# Patient Record
Sex: Male | Born: 1970 | Race: Black or African American | Hispanic: No | Smoking: Former smoker
Health system: Southern US, Community
[De-identification: ages and names within clinical notes are randomized; demographics above are authoritative.]

## PROBLEM LIST (undated history)

## (undated) DIAGNOSIS — F4310 Post-traumatic stress disorder, unspecified: Secondary | ICD-10-CM

## (undated) DIAGNOSIS — F32A Depression, unspecified: Secondary | ICD-10-CM

## (undated) DIAGNOSIS — I10 Essential (primary) hypertension: Secondary | ICD-10-CM

## (undated) HISTORY — PX: ABDOMINAL SURGERY: SHX537

---

## 2007-04-12 ENCOUNTER — Emergency Department: Payer: Self-pay | Admitting: Emergency Medicine

## 2010-10-06 ENCOUNTER — Emergency Department: Payer: Self-pay | Admitting: Emergency Medicine

## 2018-11-24 ENCOUNTER — Encounter: Payer: Self-pay | Admitting: Emergency Medicine

## 2018-11-24 ENCOUNTER — Emergency Department
Admission: EM | Admit: 2018-11-24 | Discharge: 2018-11-24 | Disposition: A | Discharge status: Home / Self Care | Payer: Self-pay | Attending: Emergency Medicine | Admitting: Emergency Medicine

## 2018-11-24 ENCOUNTER — Other Ambulatory Visit: Payer: Self-pay

## 2018-11-24 DIAGNOSIS — R739 Hyperglycemia, unspecified: Secondary | ICD-10-CM

## 2018-11-24 DIAGNOSIS — Z79899 Other long term (current) drug therapy: Secondary | ICD-10-CM | POA: Insufficient documentation

## 2018-11-24 DIAGNOSIS — E1165 Type 2 diabetes mellitus with hyperglycemia: Secondary | ICD-10-CM | POA: Insufficient documentation

## 2018-11-24 DIAGNOSIS — I10 Essential (primary) hypertension: Secondary | ICD-10-CM | POA: Insufficient documentation

## 2018-11-24 DIAGNOSIS — E119 Type 2 diabetes mellitus without complications: Secondary | ICD-10-CM

## 2018-11-24 DIAGNOSIS — R350 Frequency of micturition: Secondary | ICD-10-CM | POA: Insufficient documentation

## 2018-11-24 HISTORY — DX: Essential (primary) hypertension: I10

## 2018-11-24 LAB — BASIC METABOLIC PANEL
ANION GAP: 11 (ref 5–15)
BUN: 14 mg/dL (ref 6–20)
CHLORIDE: 98 mmol/L (ref 98–111)
CO2: 23 mmol/L (ref 22–32)
Calcium: 9.3 mg/dL (ref 8.9–10.3)
Creatinine, Ser: 1.04 mg/dL (ref 0.61–1.24)
GFR calc Af Amer: >60 mL/min (ref >60)
GFR calc non Af Amer: >60 mL/min (ref >60)
Glucose, Bld: 542 mg/dL (ref 70–99)
POTASSIUM: 4 mmol/L (ref 3.5–5.1)
Sodium: 132 mmol/L — ABNORMAL LOW (ref 135–145)

## 2018-11-24 LAB — CBC
HCT: 47.6 % (ref 39.0–52.0)
Hemoglobin: 14.5 g/dL (ref 13.0–17.0)
MCH: 26 pg (ref 26.0–34.0)
MCHC: 30.5 g/dL (ref 30.0–36.0)
MCV: 85.5 fL (ref 80.0–100.0)
NRBC: 0 % (ref 0.0–0.2)
PLATELETS: 297 10*3/uL (ref 150–400)
RBC: 5.57 MIL/uL (ref 4.22–5.81)
RDW: 14 % (ref 11.5–15.5)
WBC: 5.8 10*3/uL (ref 4.0–10.5)

## 2018-11-24 LAB — GLUCOSE, CAPILLARY
Glucose-Capillary: 453 mg/dL — ABNORMAL HIGH (ref 70–99)
Glucose-Capillary: 302 mg/dL — ABNORMAL HIGH (ref 70–99)

## 2018-11-24 MED ORDER — SODIUM CHLORIDE 0.9 % IV BOLUS
1000.0000 mL | Freq: Once | INTRAVENOUS | Status: AC
  Administered 2018-11-24: 1000 mL via INTRAVENOUS

## 2018-11-24 MED ORDER — METFORMIN HCL 500 MG PO TABS: 500.0000 mg | ORAL_TABLET | Freq: Two times a day (BID) | ORAL | 11 refills | Status: DC

## 2018-11-24 MED ORDER — METFORMIN HCL 500 MG PO TABS
500.0000 mg | ORAL_TABLET | Freq: Once | ORAL | Status: AC
  Administered 2018-11-24: 500 mg via ORAL
  Filled 2018-11-24: qty 1

## 2018-11-24 MED ORDER — INSULIN ASPART 100 UNIT/ML ~~LOC~~ SOLN
4.0000 [IU] | Freq: Once | SUBCUTANEOUS | Status: AC
  Administered 2018-11-24: 4 [IU] via INTRAVENOUS
  Filled 2018-11-24: qty 1

## 2018-11-24 MED ORDER — ENALAPRIL MALEATE 5 MG PO TABS: 5.0000 mg | ORAL_TABLET | Freq: Every day | ORAL | 11 refills | Status: DC

## 2018-11-24 MED ORDER — ENALAPRIL MALEATE 10 MG PO TABS
5.0000 mg | ORAL_TABLET | Freq: Once | ORAL | Status: AC
  Administered 2018-11-24: 5 mg via ORAL
  Filled 2018-11-24: qty 1

## 2018-11-24 NOTE — ED Triage Notes (Signed)
Tuesday, pt went to his wife's work to have blood sugar checked and it was in the 500's, pt has been thirsty and urinating a lot recently, no history of diabetes, NAD.

## 2018-11-24 NOTE — Discharge Instructions (Addendum)
Please start the Metformin for the diabetes,  Take it 1 twice a day.  Take Enalapril 1 a day for your blood pressure.  Please return if your blood sugar goes over 300.  Please get a primary care doctor to follow-up with you.  You can try the health department or the [facility] or the [facility] or the [facility] or the [facility] or you can try [facility] charity care.  For now I would do fingersticks before breakfast, before lunch, before dinner and before bed.  Keep a written record of your fingersticks every day.  Feel free to return here for any problems.  Please understand that it is very likely that we will have to increase the dose of the Metformin and the enalapril to control her diabetes and her blood pressure.  We will start out low and go up.

## 2018-11-24 NOTE — ED Provider Notes (Signed)
[facility] Emergency Department Provider Note         ____________________________________________   First MD Initiated Contact with Patient 11/24/18 1135     (approximate)  I have reviewed the triage vital signs and the nursing notes.   HISTORY  Chief Complaint Hyperglycemia   HPI Cameron Prince is a 48 y.o. male who was recently released from prison.  He was released with months worth of metoprolol and HCTZ prescription but has run out.  He also reports he has been drinking a lot of fluid lately and urinating a lot lately his wife did a fingerstick on him today and found that his blood sugar was over 500.  He is come in the hospital here.  He denies any coughing sneezing belly aches dysuria or any other problems.  Past Medical History:  Diagnosis Date  . Hypertension     There are no active problems to display for this patient.   Past Surgical History:  Procedure Laterality Date  . ABDOMINAL SURGERY      Prior to Admission medications   Medication Sig Start Date End Date Taking? Authorizing Provider  hydrochlorothiazide (HYDRODIURIL) 25 MG tablet Take 25 mg by mouth daily.   Yes [provider]  metoprolol tartrate (LOPRESSOR) 50 MG tablet Take 50 mg by mouth 2 (two) times daily.   Yes [provider]  enalapril (VASOTEC) 5 MG tablet Take 1 tablet (5 mg total) by mouth daily. 11/24/18 11/24/19  Arnaldo Natal, MD  metFORMIN (GLUCOPHAGE) 500 MG tablet Take 1 tablet (500 mg total) by mouth 2 (two) times daily with a meal. 11/24/18 11/24/19  Arnaldo Natal, MD    Allergies Patient has no known allergies.  No family history on file.  Social History Social History   Tobacco Use  . Smoking status: Never Smoker  . Smokeless tobacco: Never Used  Substance Use Topics  . Alcohol use: Not Currently  . Drug use: Not on file    Review of Systems  Constitutional: No fever/chills Eyes: No visual changes. ENT: No sore  throat. Cardiovascular: Denies chest pain. Respiratory: Denies shortness of breath. Gastrointestinal: No abdominal pain.  No nausea, no vomiting.  No diarrhea.  No constipation. Genitourinary: Negative for dysuria. Musculoskeletal: Negative for back pain. Skin: Negative for rash. Neurological: Negative for headaches, focal weakness   ____________________________________________   PHYSICAL EXAM:  VITAL SIGNS: ED Triage Vitals  Enc Vitals Group     BP 11/24/18 1020 (!) 157/92     Pulse Rate 11/24/18 1020 82     Resp 11/24/18 1020 16     Temp 11/24/18 1020 97.7 F (36.5 C)     Temp Source 11/24/18 1020 Oral     SpO2 11/24/18 1020 98 %     Weight 11/24/18 1021 (!) 340 lb (154.2 kg)     Height 11/24/18 1021 6\' 4"  (1.93 m)     Head Circumference --      Peak Flow --      Pain Score 11/24/18 1020 7     Pain Loc --      Pain Edu? --      Excl. in GC? --     Constitutional: Alert and oriented. Well appearing and in no acute distress. Eyes: Conjunctivae are normal. Head: Atraumatic. Nose: No congestion/rhinnorhea. Mouth/Throat: Mucous membranes are moist.  Oropharynx non-erythematous. Neck: No stridor.  Cardiovascular: Normal rate, regular rhythm. Grossly normal heart sounds.  Good peripheral circulation. Respiratory: Normal respiratory effort.  No retractions. Lungs CTAB. Gastrointestinal: Soft and nontender. No distention. No abdominal bruits. No CVA tenderness. }Musculoskeletal: No lower extremity tenderness slight trace edema.  Neurologic:  Normal speech and language. No gross focal neurologic deficits are appreciated.. Skin:  Skin is warm, dry and intact. No rash noted. Psychiatric: Mood and affect are normal. Speech and behavior are normal.  ____________________________________________   LABS (all labs ordered are listed, but only abnormal results are displayed)  Labs Reviewed  BASIC METABOLIC PANEL - Abnormal; Notable for the following components:      Result Value    Sodium 132 (*)    Glucose, Bld 542 (*)    All other components within normal limits  GLUCOSE, CAPILLARY - Abnormal; Notable for the following components:   Glucose-Capillary 453 (*)    All other components within normal limits  GLUCOSE, CAPILLARY - Abnormal; Notable for the following components:   Glucose-Capillary 302 (*)    All other components within normal limits  CBC   ____________________________________________  EKG   ____________________________________________  RADIOLOGY  ED MD interpretation:  Official radiology report(s): No results found.  ____________________________________________   PROCEDURES  Procedure(s) performed:   Procedures  Critical Care performed:   ____________________________________________   INITIAL IMPRESSION / ASSESSMENT AND PLAN / ED COURSE Patient with new onset diabetes.  Sugar comes down nicely with fluids and little bit of insulin.  We will get him started on metformin.  He is out of his HCTZ and metoprolol.  In view of his new onset diabetes we will switch him to enalapril hopefully this will work for him without causing any cough or any other complications.  He knows to come back if he has any other problems.  He will call from the pharmacy or have the pharmacy call while he is there if any of this is too expensive for him.  We will try to arrange follow-up either with [facility] or the [facility] or health department etc.          ____________________________________________   FINAL CLINICAL IMPRESSION(S) / ED DIAGNOSES  Final diagnoses:  Hyperglycemia  Diabetes mellitus, new onset [facility])     ED Discharge Orders         Ordered    metFORMIN (GLUCOPHAGE) 500 MG tablet  2 times daily with meals     11/24/18 1411    enalapril (VASOTEC) 5 MG tablet  Daily     11/24/18 1411           Note:  This document was prepared using Dragon voice recognition software and may include unintentional dictation errors.     Arnaldo Natal, MD 11/24/18 2134

## 2018-12-05 ENCOUNTER — Other Ambulatory Visit: Payer: Self-pay

## 2018-12-05 ENCOUNTER — Inpatient Hospital Stay
Admission: AD | Admit: 2018-12-05 | Discharge: 2018-12-07 | DRG: 885 | Disposition: A | Discharge status: Home / Self Care | Payer: No Typology Code available for payment source | Source: Intra-hospital | Attending: Psychiatry | Admitting: Psychiatry

## 2018-12-05 ENCOUNTER — Emergency Department
Admission: EM | Admit: 2018-12-05 | Discharge: 2018-12-05 | Disposition: A | Discharge status: Other Acute Inpatient Hospital | Payer: Self-pay | Attending: Emergency Medicine | Admitting: Emergency Medicine

## 2018-12-05 ENCOUNTER — Encounter: Payer: Self-pay | Admitting: Emergency Medicine

## 2018-12-05 DIAGNOSIS — Z888 Allergy status to other drugs, medicaments and biological substances status: Secondary | ICD-10-CM

## 2018-12-05 DIAGNOSIS — F4310 Post-traumatic stress disorder, unspecified: Secondary | ICD-10-CM | POA: Diagnosis present

## 2018-12-05 DIAGNOSIS — I10 Essential (primary) hypertension: Secondary | ICD-10-CM | POA: Diagnosis present

## 2018-12-05 DIAGNOSIS — F1210 Cannabis abuse, uncomplicated: Secondary | ICD-10-CM | POA: Diagnosis present

## 2018-12-05 DIAGNOSIS — R45851 Suicidal ideations: Secondary | ICD-10-CM

## 2018-12-05 DIAGNOSIS — F333 Major depressive disorder, recurrent, severe with psychotic symptoms: Secondary | ICD-10-CM | POA: Diagnosis present

## 2018-12-05 DIAGNOSIS — F1490 Cocaine use, unspecified, uncomplicated: Secondary | ICD-10-CM | POA: Diagnosis present

## 2018-12-05 DIAGNOSIS — F1420 Cocaine dependence, uncomplicated: Secondary | ICD-10-CM | POA: Diagnosis present

## 2018-12-05 DIAGNOSIS — F329 Major depressive disorder, single episode, unspecified: Secondary | ICD-10-CM | POA: Insufficient documentation

## 2018-12-05 DIAGNOSIS — Z7984 Long term (current) use of oral hypoglycemic drugs: Secondary | ICD-10-CM | POA: Diagnosis not present

## 2018-12-05 DIAGNOSIS — Z560 Unemployment, unspecified: Secondary | ICD-10-CM

## 2018-12-05 DIAGNOSIS — Z79899 Other long term (current) drug therapy: Secondary | ICD-10-CM | POA: Insufficient documentation

## 2018-12-05 DIAGNOSIS — E119 Type 2 diabetes mellitus without complications: Secondary | ICD-10-CM | POA: Diagnosis present

## 2018-12-05 DIAGNOSIS — R440 Auditory hallucinations: Secondary | ICD-10-CM | POA: Insufficient documentation

## 2018-12-05 DIAGNOSIS — F1410 Cocaine abuse, uncomplicated: Secondary | ICD-10-CM

## 2018-12-05 DIAGNOSIS — F1290 Cannabis use, unspecified, uncomplicated: Secondary | ICD-10-CM

## 2018-12-05 LAB — COMPREHENSIVE METABOLIC PANEL
ALK PHOS: 59 U/L (ref 38–126)
ALT: 22 U/L (ref 0–44)
AST: 23 U/L (ref 15–41)
Albumin: 3.9 g/dL (ref 3.5–5.0)
Anion gap: 10 (ref 5–15)
BUN: 8 mg/dL (ref 6–20)
CO2: 24 mmol/L (ref 22–32)
Calcium: 9.4 mg/dL (ref 8.9–10.3)
Chloride: 105 mmol/L (ref 98–111)
Creatinine, Ser: 0.87 mg/dL (ref 0.61–1.24)
GFR calc Af Amer: >60 mL/min (ref >60)
GFR calc non Af Amer: >60 mL/min (ref >60)
Glucose, Bld: 286 mg/dL — ABNORMAL HIGH (ref 70–99)
Potassium: 3.7 mmol/L (ref 3.5–5.1)
Sodium: 139 mmol/L (ref 135–145)
Total Bilirubin: 2 mg/dL — ABNORMAL HIGH (ref 0.3–1.2)
Total Protein: 8.2 g/dL — ABNORMAL HIGH (ref 6.5–8.1)

## 2018-12-05 LAB — URINE DRUG SCREEN, QUALITATIVE (ARMC ONLY)
Amphetamines, Ur Screen: NOT DETECTED
Barbiturates, Ur Screen: NOT DETECTED
Benzodiazepine, Ur Scrn: NOT DETECTED
CANNABINOID 50 NG, UR ~~LOC~~: NOT DETECTED
Cocaine Metabolite,Ur ~~LOC~~: POSITIVE — AB
MDMA (Ecstasy)Ur Screen: NOT DETECTED
Methadone Scn, Ur: NOT DETECTED
Opiate, Ur Screen: NOT DETECTED
Phencyclidine (PCP) Ur S: NOT DETECTED
Tricyclic, Ur Screen: NOT DETECTED

## 2018-12-05 LAB — CBC
HCT: 45.6 % (ref 39.0–52.0)
HEMOGLOBIN: 14 g/dL (ref 13.0–17.0)
MCH: 26.8 pg (ref 26.0–34.0)
MCHC: 30.7 g/dL (ref 30.0–36.0)
MCV: 87.2 fL (ref 80.0–100.0)
Platelets: 351 10*3/uL (ref 150–400)
RBC: 5.23 MIL/uL (ref 4.22–5.81)
RDW: 14.6 % (ref 11.5–15.5)
WBC: 8 10*3/uL (ref 4.0–10.5)
nRBC: 0.3 % — ABNORMAL HIGH (ref 0.0–0.2)

## 2018-12-05 LAB — HEMOGLOBIN A1C
HEMOGLOBIN A1C: 12.1 % — AB (ref 4.8–5.6)
Mean Plasma Glucose: 300.57 mg/dL

## 2018-12-05 LAB — ETHANOL: Alcohol, Ethyl (B): <10 mg/dL (ref <10)

## 2018-12-05 LAB — SALICYLATE LEVEL

## 2018-12-05 LAB — ACETAMINOPHEN LEVEL: Acetaminophen (Tylenol), Serum: <10 ug/mL — ABNORMAL LOW (ref 10–30)

## 2018-12-05 MED ORDER — TRAZODONE HCL 50 MG PO TABS: 50.0000 mg | ORAL_TABLET | Freq: Every evening | ORAL | Status: DC | PRN

## 2018-12-05 MED ORDER — HYDROXYZINE HCL 25 MG PO TABS: 25.0000 mg | ORAL_TABLET | ORAL | Status: DC | PRN

## 2018-12-05 MED ORDER — ALUM & MAG HYDROXIDE-SIMETH 200-200-20 MG/5ML PO SUSP: 30.0000 mL | ORAL | Status: DC | PRN

## 2018-12-05 MED ORDER — FLUOXETINE HCL 20 MG PO CAPS
40.0000 mg | ORAL_CAPSULE | Freq: Every day | ORAL | Status: DC
  Administered 2018-12-05: 40 mg via ORAL
  Filled 2018-12-05: qty 2

## 2018-12-05 MED ORDER — ACETAMINOPHEN 325 MG PO TABS: 650.0000 mg | ORAL_TABLET | Freq: Four times a day (QID) | ORAL | Status: DC | PRN

## 2018-12-05 MED ORDER — ENALAPRIL MALEATE 5 MG PO TABS
5.0000 mg | ORAL_TABLET | Freq: Every day | ORAL | Status: DC
  Administered 2018-12-06 – 2018-12-07 (×2): 5 mg via ORAL
  Filled 2018-12-05 (×2): qty 1

## 2018-12-05 MED ORDER — MAGNESIUM HYDROXIDE 400 MG/5ML PO SUSP: 30.0000 mL | Freq: Every day | ORAL | Status: DC | PRN

## 2018-12-05 MED ORDER — ENALAPRIL MALEATE 5 MG PO TABS
5.0000 mg | ORAL_TABLET | Freq: Every day | ORAL | Status: DC
  Administered 2018-12-05: 5 mg via ORAL
  Filled 2018-12-05: qty 1

## 2018-12-05 MED ORDER — METFORMIN HCL 500 MG PO TABS
500.0000 mg | ORAL_TABLET | Freq: Two times a day (BID) | ORAL | Status: DC
  Administered 2018-12-06 – 2018-12-07 (×3): 500 mg via ORAL
  Filled 2018-12-05 (×3): qty 1

## 2018-12-05 MED ORDER — METFORMIN HCL 500 MG PO TABS
500.0000 mg | ORAL_TABLET | Freq: Two times a day (BID) | ORAL | Status: DC
  Administered 2018-12-05: 500 mg via ORAL
  Filled 2018-12-05: qty 1

## 2018-12-05 NOTE — ED Notes (Signed)
VOL/ Consult completed/ Plan to Admit to [facility] BMU

## 2018-12-05 NOTE — ED Notes (Signed)
The patient was dressed out into the required purple scrubs. His belongings were placed into two white patient belongings bags and labeled properly.  Cloths consisted of black boots, black socks, black phone, black case with glucose check pen in it, grey/black shorts, grey underwear, faded black pants, grey shirt and grey sweatshirt. He was then walked to room 20 without any issues. Belongings were left with quad RN.

## 2018-12-05 NOTE — Progress Notes (Signed)
Patient is a new admit from ED who present him self voluntarily, for suicide ideations with plan to over dose on drugs, did not specify what kind of drugs, patient contract for safety at this time and denies any SI/HI/ AVH.  Patient endorse using cocaine and marijuana but not using cigarettes. Patient is alert and oriented x 4 stable and responding well thoughtful and logical , pleasant  upon approach, body search and skin check is complete by two nurses, no contraband found, multiple tattoos on the surface of the skin. Unit guide lines and expected behaviors are dicussed, cold tray and beverages is offered , hygiene product provided, unit and room orientation complete , room is within eyesight for frequent monitoring from the nurses station, mood is good and affect is withdrawn, remind patient of 15 minute safety rounding is maintained no distress.

## 2018-12-05 NOTE — ED Notes (Signed)
Hourly rounding reveals patient in room. No complaints, stable, in no acute distress. Q15 minute rounds and monitoring via Security Cam[employer] to continue. 

## 2018-12-05 NOTE — ED Notes (Signed)
First Nurse Note: Patient alert and oriented.  States he is here for "mental issues".

## 2018-12-05 NOTE — ED Provider Notes (Addendum)
[facility] Emergency Department Provider Note  ____________________________________________  Time seen: Approximately 11:43 AM  I have reviewed the triage vital signs and the nursing notes.   HISTORY  Chief Complaint Suicidal    HPI Cameron Prince is a 48 y.o. male with a history of hypertension, recently diagnosed type 2 diabetes, and schizophrenia who comes the ED complaining of suicidal ideation with thoughts about taking an intentional overdose.  Does not currently have an active wish to die.  He does report some auditory hallucinations, no visual hallucinations or HI.  Is been off his medications for the past 4 months since being released from incarceration.   Symptoms constant without aggravating or alleviating factors  Also complains of chronic right toes numbness.  No recent injuries.    Past Medical History:  Diagnosis Date  . Hypertension      There are no active problems to display for this patient.    Past Surgical History:  Procedure Laterality Date  . ABDOMINAL SURGERY       Prior to Admission medications   Medication Sig Start Date End Date Taking? Authorizing Provider  enalapril (VASOTEC) 5 MG tablet Take 1 tablet (5 mg total) by mouth daily. 11/24/18 11/24/19  Arnaldo Natal, MD  hydrochlorothiazide (HYDRODIURIL) 25 MG tablet Take 25 mg by mouth daily.    [provider]  metFORMIN (GLUCOPHAGE) 500 MG tablet Take 1 tablet (500 mg total) by mouth 2 (two) times daily with a meal. 11/24/18 11/24/19  Arnaldo Natal, MD  metoprolol tartrate (LOPRESSOR) 50 MG tablet Take 50 mg by mouth 2 (two) times daily.    [provider]     Allergies Patient has no known allergies.   No family history on file.  Social History Social History   Tobacco Use  . Smoking status: Never Smoker  . Smokeless tobacco: Never Used  Substance Use Topics  . Alcohol use: Not Currently  . Drug use: Not on file    Review of  Systems  Constitutional:   No fever or chills.  ENT:   No sore throat. No rhinorrhea. Cardiovascular:   No chest pain or syncope. Respiratory:   No dyspnea or cough. Gastrointestinal:   Negative for abdominal pain, vomiting and diarrhea.  Musculoskeletal:   Numbness in right toes All other systems reviewed and are negative except as documented above in ROS and HPI.  ____________________________________________   PHYSICAL EXAM:  VITAL SIGNS: ED Triage Vitals  Enc Vitals Group     BP 12/05/18 1005 139/74     Pulse Rate 12/05/18 1005 75     Resp 12/05/18 1005 18     Temp 12/05/18 1005 98.7 F (37.1 C)     Temp Source 12/05/18 1005 Oral     SpO2 12/05/18 1005 95 %     Weight 12/05/18 1004 (!) 325 lb (147.4 kg)     Height 12/05/18 1004 6\' 4"  (1.93 m)     Head Circumference --      Peak Flow --      Pain Score 12/05/18 1012 0     Pain Loc --      Pain Edu? --      Excl. in GC? --     Vital signs reviewed, nursing assessments reviewed.   Constitutional:   Alert and oriented. Non-toxic appearance. Eyes:   Conjunctivae are normal. EOMI ENT      Head:   Normocephalic and atraumatic.      Nose:  No congestion/rhinnorhea.       Mouth/Throat:   MMM      Neck:   No meningismus. Full ROM. Hematological/Lymphatic/Immunilogical:   No cervical lymphadenopathy. Cardiovascular:   RRR.  Cap refill less than 2 seconds. Respiratory:   Normal respiratory effort without tachypnea/retractions. Breath sounds are clear and equal bilaterally Gastrointestinal:   Soft and nontender. Non distended. No rebound, rigidity, or guarding. Musculoskeletal:   Normal range of motion in all extremities. No joint effusions.  No lower extremity tenderness.  No edema.  Right foot unremarkable Neurologic:   Normal speech and language.  Motor grossly intact. No acute focal neurologic deficits are appreciated.  Skin:    Skin is warm, dry and intact. No rash noted.  No petechiae, purpura, or  bullae.  ____________________________________________    LABS (pertinent positives/negatives) (all labs ordered are listed, but only abnormal results are displayed) Labs Reviewed  COMPREHENSIVE METABOLIC PANEL - Abnormal; Notable for the following components:      Result Value   Glucose, Bld 286 (*)    Total Protein 8.2 (*)    Total Bilirubin 2.0 (*)    All other components within normal limits  ACETAMINOPHEN LEVEL - Abnormal; Notable for the following components:   Acetaminophen (Tylenol), Serum <10 (*)    All other components within normal limits  CBC - Abnormal; Notable for the following components:   nRBC 0.3 (*)    All other components within normal limits  ETHANOL  SALICYLATE LEVEL  URINE DRUG SCREEN, QUALITATIVE ([facility] ONLY)   ____________________________________________   EKG    ____________________________________________    RADIOLOGY  No results found.  ____________________________________________   PROCEDURES Procedures  ____________________________________________    CLINICAL IMPRESSION / ASSESSMENT AND PLAN / ED COURSE  Medications ordered in the ED: Medications  enalapril (VASOTEC) tablet 5 mg (5 mg Oral Given 12/05/18 1123)  metFORMIN (GLUCOPHAGE) tablet 500 mg (has no administration in time range)    Pertinent labs & imaging results that were available during my care of the patient were reviewed by me and considered in my medical decision making (see chart for details).    Patient presents with psychiatric symptoms in the setting of medication noncompliance.  Continue his medications for hypertension and diabetes.  Case discussed with psychiatry Dr. Judie Petit who plans to admit for further psychiatric stabilization.      ____________________________________________   FINAL CLINICAL IMPRESSION(S) / ED DIAGNOSES    Final diagnoses:  Suicidal ideation  Auditory hallucination     ED Discharge Orders    None      Portions of this note  were generated with dragon dictation software. Dictation errors may occur despite best attempts at proofreading.   Sharman Cheek, MD 12/05/18 1147    Sharman Cheek, MD 12/05/18 [phone]

## 2018-12-05 NOTE — BH Assessment (Signed)
Patient is to be admitted to [facility] by Dr. Viviano Simas.  Attending Physician will be Dr. Jennet Maduro.   Patient has been assigned to room 322-A, by [facility] Charge Nurse Gwen.   Intake Paper Work has been signed and placed on patient chart.  ER staff is aware of the admission:  Misty Stanley, ER Secretary    Dr. Scotty Court, ER MD   Gean Quint, Patient's Nurse   Ethelene Browns, Patient Access.

## 2018-12-05 NOTE — ED Triage Notes (Signed)
Patient states he has been having intense suicidal thoughts for the past 3 days.  Patient reports thinking of gathering pills and overdosing.  Patient also reports occasional auditory hallucinations.  Patient states he was in jail for 7 years and was given psychiatric medications regularly.  Patient states since being released from jail he has not had insurance or a job and has been unable to get his medications and has not taken them for 4 months.
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
## 2018-12-05 NOTE — ED Notes (Signed)
Pt given food tray along with diet ginger ale. No other needs voiced at this time. Will continue to monitor Q15 minute rounds.

## 2018-12-05 NOTE — ED Notes (Signed)
Report to include Situation, Background, Assessment, and Recommendations received from Jadeka RN. Patient alert and oriented, warm and dry, in no acute distress. Patient denies SI, HI, AVH and pain. Patient made aware of Q15 minute rounds and security cameras for their safety. Patient instructed to come to me with needs or concerns. 

## 2018-12-06 DIAGNOSIS — F333 Major depressive disorder, recurrent, severe with psychotic symptoms: Principal | ICD-10-CM

## 2018-12-06 LAB — URINE DRUG SCREEN, QUALITATIVE (ARMC ONLY)
Amphetamines, Ur Screen: NOT DETECTED
BENZODIAZEPINE, UR SCRN: NOT DETECTED
Barbiturates, Ur Screen: NOT DETECTED
Cannabinoid 50 Ng, Ur ~~LOC~~: NOT DETECTED
Cocaine Metabolite,Ur ~~LOC~~: POSITIVE — AB
MDMA (Ecstasy)Ur Screen: NOT DETECTED
Methadone Scn, Ur: NOT DETECTED
Opiate, Ur Screen: NOT DETECTED
Phencyclidine (PCP) Ur S: NOT DETECTED
Tricyclic, Ur Screen: NOT DETECTED

## 2018-12-06 MED ORDER — ADULT MULTIVITAMIN W/MINERALS CH
1.0000 | ORAL_TABLET | Freq: Every day | ORAL | Status: DC
  Administered 2018-12-07: 1 via ORAL
  Filled 2018-12-06: qty 1

## 2018-12-06 MED ORDER — ENSURE ENLIVE PO LIQD
237.0000 mL | Freq: Three times a day (TID) | ORAL | Status: DC
  Administered 2018-12-06 – 2018-12-07 (×2): 237 mL via ORAL

## 2018-12-06 MED ORDER — MIRTAZAPINE 15 MG PO TABS
15.0000 mg | ORAL_TABLET | Freq: Every day | ORAL | Status: DC
  Administered 2018-12-06: 15 mg via ORAL
  Filled 2018-12-06: qty 1

## 2018-12-06 MED ORDER — PAROXETINE HCL 30 MG PO TABS
30.0000 mg | ORAL_TABLET | Freq: Every day | ORAL | Status: DC
  Administered 2018-12-07: 30 mg via ORAL
  Filled 2018-12-06: qty 1

## 2018-12-06 NOTE — [facility] Suicide Risk Assessment (Signed)
[facility] INPATIENT:  Family/Significant Other Suicide Prevention Education  Suicide Prevention Education:  Contact Attempts: Cameron Prince [phone] has been identified by the patient as the family member/significant other with whom the patient will be residing, and identified as the person(s) who will aid the patient in the event of a mental health crisis.  With written consent from the patient, two attempts were made to provide suicide prevention education, prior to and/or following the patient's discharge.  We were unsuccessful in providing suicide prevention education.  A suicide education pamphlet was given to the patient to share with family/significant other.  Date and time of first attempt: 12/06/2018 at 2:10PM Date and time of second attempt:  CSW left a HIPAA compliant voicemail requesting a call back.   Cameron Prince MSW LCSW 12/06/2018, 2:09 PM

## 2018-12-06 NOTE — Tx Team (Signed)
Initial Treatment Plan 12/06/2018 12:03 AM [name] FMB:[id number]    PATIENT STRESSORS: Financial difficulties Marital or family conflict Medication change or noncompliance Substance abuse   PATIENT STRENGTHS: Average or above average intelligence Capable of independent living Motivation for treatment/growth   PATIENT IDENTIFIED PROBLEMS: Illicit Drug use    Depression/Anxiety    None compliance prescription drug    Unemployed          DISCHARGE CRITERIA:  Adequate post-discharge living arrangements Motivation to continue treatment in a less acute level of care Reduction of life-threatening or endangering symptoms to within safe limits  PRELIMINARY DISCHARGE PLAN: Attend PHP/IOP Participate in family therapy Return to previous living arrangement  PATIENT/FAMILY INVOLVEMENT: This treatment plan has been presented to and reviewed with the patient, Cameron Prince  The patient have been given the opportunity to ask questions and make suggestions.  Lelan Pons, RN 12/06/2018, 12:03 AM

## 2018-12-06 NOTE — Plan of Care (Signed)
Patient  is knowledgeable of [place]   education verbalize understanding . Emotional and mental status  improving . Attending unit programing  , able to verbalize feelings  voice no concerns around sleep . No anger management . Working  on coping and Psychologist, forensic . Voice no safety concerns ,  denies suicidal  Compliant  with medication  verbalize understanding  denies suicidal ideations.   Problem: Coping: Goal: Coping ability will improve 12/06/2018 1814 by Crist Infante, RN Outcome: Progressing 12/06/2018 1620 by Crist Infante, RN Outcome: Progressing   Problem: Medication: Goal: Compliance with prescribed medication regimen will improve 12/06/2018 1814 by Crist Infante, RN Outcome: Progressing 12/06/2018 1620 by Crist Infante, RN Outcome: Progressing   Problem: Coping: Goal: Coping ability will improve 12/06/2018 1814 by Crist Infante, RN Outcome: Progressing 12/06/2018 1620 by Crist Infante, RN Outcome: Progressing Goal: Will verbalize feelings 12/06/2018 1814 by Crist Infante, RN Outcome: Progressing 12/06/2018 1620 by Crist Infante, RN Outcome: Progressing   Problem: Health Behavior/Discharge Planning: Goal: Ability to make decisions will improve 12/06/2018 1814 by Crist Infante, RN Outcome: Progressing 12/06/2018 1620 by Crist Infante, RN Outcome: Progressing Goal: Compliance with therapeutic regimen will improve 12/06/2018 1814 by Crist Infante, RN Outcome: Progressing 12/06/2018 1620 by Crist Infante, RN Outcome: Progressing   Problem: Education: Goal: Ability to state activities that reduce stress will improve 12/06/2018 1814 by Crist Infante, RN Outcome: Progressing 12/06/2018 1620 by Crist Infante, RN Outcome: Progressing   Problem: Self-Concept: Goal: Ability to identify factors that promote anxiety will improve 12/06/2018 1814 by Crist Infante, RN Outcome: Progressing 12/06/2018 1620 by Crist Infante, RN Outcome: Progressing Goal:  Level of anxiety will decrease 12/06/2018 1814 by Crist Infante, RN Outcome: Progressing 12/06/2018 1620 by Crist Infante, RN Outcome: Progressing Goal: Ability to modify response to factors that promote anxiety will improve 12/06/2018 1814 by Crist Infante, RN Outcome: Progressing 12/06/2018 1620 by Crist Infante, RN Outcome: Progressing   Problem: Physical Regulation: Goal: Complications related to the disease process, condition or treatment will be avoided or minimized 12/06/2018 1814 by Crist Infante, RN Outcome: Progressing 12/06/2018 1620 by Crist Infante, RN Outcome: Progressing   Problem: Safety: Goal: Ability to remain free from injury will improve 12/06/2018 1814 by Crist Infante, RN Outcome: Progressing 12/06/2018 1620 by Crist Infante, RN Outcome: Progressing

## 2018-12-06 NOTE — Progress Notes (Addendum)
Recreation Therapy Notes  Date: 12/06/2018  Time: 9:30 am  Location: Craft Room  Behavioral response: Appropriate  Intervention Topic: Team Work  Discussion/Intervention:  Group content on today was focused on teamwork. The group identified what teamwork is. Individuals described who is a part of their team. Patients expressed why they thought teamwork is important. The group stated reasons why they thought it was easier to work with a Comptroller team. Individuals discussed some positives and negatives of working with a team. Patients gave examples of past experiences they had while working with a team. The group participated in the intervention "Story in a bag", patients were in groups and were able to test their skill in a team setting.  Clinical Observations/Feedback:  Patient came to group was focused on what peers and staff had to say about team work. Individual was social with peers and staff while participating in group.  Cameron Prince LRT/CTRS         Cameron Prince 12/06/2018 11:19 AM
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
## 2018-12-06 NOTE — Progress Notes (Addendum)
D: Patient stated slept good last night .Stated appetite is good and energy level  Is normal. Stated concentration is good . Stated on Depression scale 6 , hopeless  6 and anxiety .( low 0-10 high) Denies suicidal  homicidal ideations  .  No auditory hallucinations  No pain concerns . Appropriate ADL'S. Interacting with peers and staff. Patient  is knowledgeable of [place]   education verbalize understanding . Emotional and mental status  improving . Attending unit programing  , able to verbalize feelings  voice no concerns around sleep . No anger management . Working  on coping and Psychologist, forensic . Voice no safety concerns ,  denies suicidal  Compliant  with medication  verbalize understanding  denies suicidal ideations. I A: Encourage patient participation with unit programming . Instruction  Given on  Medication , verbalize understanding.  R: Voice no other concerns. Staff continue to monitor

## 2018-12-06 NOTE — [facility] Group Notes (Signed)
Feelings Around Diagnosis 12/06/2018 1PM  Type of Therapy/Topic:  Group Therapy:  Feelings about Diagnosis  Participation Level:  Minimal   Description of Group:   This group will allow patients to explore their thoughts and feelings about diagnoses they have received. Patients will be guided to explore their level of understanding and acceptance of these diagnoses. Facilitator will encourage patients to process their thoughts and feelings about the reactions of others to their diagnosis and will guide patients in identifying ways to discuss their diagnosis with significant others in their lives. This group will be process-oriented, with patients participating in exploration of their own experiences, giving and receiving support, and processing challenge from other group members.   Therapeutic Goals: 1. Patient will demonstrate understanding of diagnosis as evidenced by identifying two or more symptoms of the disorder 2. Patient will be able to express two feelings regarding the diagnosis 3. Patient will demonstrate their ability to communicate their needs through discussion and/or role play  Summary of Patient Progress:  Minimal participation during group. Pt sat quietly, no input provided.     Therapeutic Modalities:   Cognitive Behavioral Therapy Brief Therapy Feelings Identification    Suzan Slick, LCSW 12/06/2018 2:34 PM
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
## 2018-12-06 NOTE — [facility] Suicide Risk Assessment (Signed)
[facility] Admission Suicide Risk Assessment   Nursing information obtained from:  Patient Demographic factors:  Male Current Mental Status:  Suicide plan Loss Factors:  Financial problems / change in socioeconomic status Historical Factors:  Impulsivity Risk Reduction Factors:  Positive therapeutic relationship  Total Time spent with patient: 1 hour Principal Problem: MDD (major depressive disorder), recurrent, severe, with psychosis (HCC) Diagnosis:  Principal Problem:   MDD (major depressive disorder), recurrent, severe, with psychosis (HCC)  Subjective Data: suicidal ideation with a plan to overdose  Continued Clinical Symptoms:  Alcohol Use Disorder Identification Test Final Score (AUDIT): 6 The "Alcohol Use Disorders Identification Test", Guidelines for Use in Primary Care, Second Edition.  World Science writer [facility]). Score between 0-7:  no or low risk or alcohol related problems. Score between 8-15:  moderate risk of alcohol related problems. Score between 16-19:  high risk of alcohol related problems. Score 20 or above:  warrants further diagnostic evaluation for alcohol dependence and treatment.   CLINICAL FACTORS:   Depression:   Comorbid alcohol abuse/dependence Impulsivity Alcohol/Substance Abuse/Dependencies   Musculoskeletal: Strength & Muscle Tone: within normal limits Gait & Station: normal Patient leans: N/A  Psychiatric Specialty Exam: Physical Exam  Nursing note and vitals reviewed. Psychiatric: His speech is normal. He is withdrawn. Cognition and memory are normal. He expresses impulsivity. He exhibits a depressed mood. He expresses suicidal ideation. He expresses suicidal plans.    Review of Systems  Neurological: Negative.   Psychiatric/Behavioral: Positive for depression, substance abuse and suicidal ideas.  All other systems reviewed and are negative.   Blood pressure (!) 150/99, pulse 95, temperature 97.8 F (36.6 C), temperature source Oral,  resp. rate 18, height 6\' 4"  (1.93 m), weight 132 kg, SpO2 97 %.Body mass index is 35.42 kg/m.  General Appearance: Casual  Eye Contact:  Good  Speech:  Clear and Coherent  Volume:  Normal  Mood:  Depressed  Affect:  Blunt  Thought Process:  Goal Directed and Descriptions of Associations: Intact  Orientation:  Full (Time, Place, and Person)  Thought Content:  WDL  Suicidal Thoughts:  Yes.  with intent/plan  Homicidal Thoughts:  No  Memory:  Immediate;   Fair Recent;   Fair Remote;   Fair  Judgement:  Impaired  Insight:  Shallow  Psychomotor Activity:  Decreased  Concentration:  Concentration: Fair and Attention Span: Fair  Recall:  [employer] of Knowledge:  Fair  Language:  Fair  Akathisia:  No  Handed:  Right  AIMS (if indicated):     Assets:  Communication Skills Desire for Improvement Housing Social Support  ADL's:  Intact  Cognition:  WNL  Sleep:  Number of Hours: 7      COGNITIVE FEATURES THAT CONTRIBUTE TO RISK:  None    SUICIDE RISK:   Moderate:  Frequent suicidal ideation with limited intensity, and duration, some specificity in terms of plans, no associated intent, good self-control, limited dysphoria/symptomatology, some risk factors present, and identifiable protective factors, including available and accessible social support.  PLAN OF CARE: Hospital admission medication management, substance abuse counseling, discharge planning.  I certify that inpatient services furnished can reasonably be expected to improve the patient's condition.   Kristine Linea, MD 12/06/2018, 9:27 PM

## 2018-12-07 ENCOUNTER — Encounter: Payer: Self-pay | Admitting: Psychiatry

## 2018-12-07 MED ORDER — METFORMIN HCL 500 MG PO TABS: 500.0000 mg | ORAL_TABLET | Freq: Two times a day (BID) | ORAL | 11 refills | Status: DC

## 2018-12-07 MED ORDER — ENALAPRIL MALEATE 5 MG PO TABS: 5.0000 mg | ORAL_TABLET | Freq: Every day | ORAL | 11 refills | Status: DC

## 2018-12-07 MED ORDER — PAROXETINE HCL 30 MG PO TABS: 30.0000 mg | ORAL_TABLET | Freq: Every day | ORAL | 1 refills | Status: DC

## 2018-12-07 MED ORDER — MIRTAZAPINE 15 MG PO TABS: 15.0000 mg | ORAL_TABLET | Freq: Every day | ORAL | 1 refills | Status: DC

## 2018-12-07 MED ORDER — TRAZODONE HCL 50 MG PO TABS: 50.0000 mg | ORAL_TABLET | Freq: Every evening | ORAL | 1 refills | Status: DC | PRN

## 2018-12-07 NOTE — Progress Notes (Signed)
Patient denies si/hi/avh, can contract for safety. Pt. Endorses a normal mood. Pt. Denies pain. Pt. Is pleasant and cooperative.

## 2018-12-07 NOTE — Progress Notes (Signed)
Patient endorsing feeling overwhelmed  and anxious even when completing the smallest project ., become easily frustrated was also described. Patient contract for safety. Patient is compliant with her medications and unit procedures  And encouragement is provided, patient denies any SI/HI/AVH.. fully oriented . No distress, 15 minutes safety rounding is maintained.

## 2018-12-07 NOTE — Progress Notes (Signed)
D:Patient denies SI/HI at this time. Pt. Able to contract for safety inside and outside of the hospital. Pt appears calm and cooperative, and no distress noted. Pt. Denies pain.   A: All Personal items in locker returned to pt. Pt escorted out of the building by this Clinical research associate. Pt. Given extensive discharge education.   R:  Pt States he will comply with outpatient services and discharge planning put into place, and take MEDS as prescribed.

## 2018-12-07 NOTE — Tx Team (Signed)
Interdisciplinary Treatment and Diagnostic Plan Update  12/07/2018 Time of Session: 1030AM Longino Enge MRN: [id number]  Principal Diagnosis: MDD (major depressive disorder), recurrent, severe, with psychosis (HCC)  Secondary Diagnoses: Principal Problem:   MDD (major depressive disorder), recurrent, severe, with psychosis (HCC)   Current Medications:  Current Facility-Administered Medications  Medication Dose Route Frequency Provider Last Rate Last Dose  . acetaminophen (TYLENOL) tablet 650 mg  650 mg Oral Q6H PRN Mariel Craft, MD      . alum & mag hydroxide-simeth (MAALOX/MYLANTA) 200-200-20 MG/5ML suspension 30 mL  30 mL Oral Q4H PRN Mariel Craft, MD      . enalapril (VASOTEC) tablet 5 mg  5 mg Oral Daily Mariel Craft, MD   5 mg at 12/07/18 0816  . feeding supplement (ENSURE ENLIVE) (ENSURE ENLIVE) liquid 237 mL  237 mL Oral TID BM Pucilowska, Jolanta B, MD   237 mL at 12/07/18 0928  . hydrOXYzine (ATARAX/VISTARIL) tablet 25 mg  25 mg Oral Q4H PRN Mariel Craft, MD      . magnesium hydroxide (MILK OF MAGNESIA) suspension 30 mL  30 mL Oral Daily PRN Mariel Craft, MD      . metFORMIN (GLUCOPHAGE) tablet 500 mg  500 mg Oral BID WC Mariel Craft, MD   500 mg at 12/07/18 0817  . mirtazapine (REMERON) tablet 15 mg  15 mg Oral QHS Pucilowska, Jolanta B, MD   15 mg at 12/06/18 2141  . multivitamin with minerals tablet 1 tablet  1 tablet Oral Daily Pucilowska, Jolanta B, MD   1 tablet at 12/07/18 0817  . PARoxetine (PAXIL) tablet 30 mg  30 mg Oral Daily Pucilowska, Jolanta B, MD   30 mg at 12/07/18 0816  . traZODone (DESYREL) tablet 50 mg  50 mg Oral QHS PRN,MR X 1 Mariel Craft, MD       PTA Medications: Medications Prior to Admission  Medication Sig Dispense Refill Last Dose  . hydrochlorothiazide (HYDRODIURIL) 25 MG tablet Take 25 mg by mouth daily.   Not Taking at Unknown time  . metoprolol tartrate (LOPRESSOR) 50 MG tablet Take 50 mg by mouth 2 (two) times daily.    Not Taking at Unknown time  . [DISCONTINUED] enalapril (VASOTEC) 5 MG tablet Take 1 tablet (5 mg total) by mouth daily. 30 tablet 11 unknown at unknown  . [DISCONTINUED] metFORMIN (GLUCOPHAGE) 500 MG tablet Take 1 tablet (500 mg total) by mouth 2 (two) times daily with a meal. 60 tablet 11 unknown at unknown    Patient Stressors: Financial difficulties Marital or family conflict Medication change or noncompliance Substance abuse  Patient Strengths: Average or above average intelligence Capable of independent living Motivation for treatment/growth  Treatment Modalities: Medication Management, Group therapy, Case management,  1 to 1 session with clinician, Psychoeducation, Recreational therapy.   Physician Treatment Plan for Primary Diagnosis: MDD (major depressive disorder), recurrent, severe, with psychosis (HCC) Long Term Goal(s): Improvement in symptoms so as ready for discharge Improvement in symptoms so as ready for discharge   Short Term Goals: Ability to identify changes in lifestyle to reduce recurrence of condition will improve Ability to verbalize feelings will improve Ability to disclose and discuss suicidal ideas Ability to demonstrate self-control will improve Ability to identify and develop effective coping behaviors will improve Ability to maintain clinical measurements within normal limits will improve Compliance with prescribed medications will improve Ability to identify triggers associated with substance abuse/mental health issues will improve Ability to identify changes  in lifestyle to reduce recurrence of condition will improve Ability to demonstrate self-control will improve Ability to identify and develop effective coping behaviors will improve Ability to identify triggers associated with substance abuse/mental health issues will improve  Medication Management: Evaluate patient's response, side effects, and tolerance of medication regimen.  Therapeutic  Interventions: 1 to 1 sessions, Unit Group sessions and Medication administration.  Evaluation of Outcomes: Adequate for Discharge  Physician Treatment Plan for Secondary Diagnosis: Principal Problem:   MDD (major depressive disorder), recurrent, severe, with psychosis (HCC)  Long Term Goal(s): Improvement in symptoms so as ready for discharge Improvement in symptoms so as ready for discharge   Short Term Goals: Ability to identify changes in lifestyle to reduce recurrence of condition will improve Ability to verbalize feelings will improve Ability to disclose and discuss suicidal ideas Ability to demonstrate self-control will improve Ability to identify and develop effective coping behaviors will improve Ability to maintain clinical measurements within normal limits will improve Compliance with prescribed medications will improve Ability to identify triggers associated with substance abuse/mental health issues will improve Ability to identify changes in lifestyle to reduce recurrence of condition will improve Ability to demonstrate self-control will improve Ability to identify and develop effective coping behaviors will improve Ability to identify triggers associated with substance abuse/mental health issues will improve     Medication Management: Evaluate patient's response, side effects, and tolerance of medication regimen.  Therapeutic Interventions: 1 to 1 sessions, Unit Group sessions and Medication administration.  Evaluation of Outcomes: Adequate for Discharge   RN Treatment Plan for Primary Diagnosis: MDD (major depressive disorder), recurrent, severe, with psychosis (HCC) Long Term Goal(s): Knowledge of disease and therapeutic regimen to maintain health will improve  Short Term Goals: Ability to demonstrate self-control, Ability to participate in decision making will improve and Ability to identify and develop effective coping behaviors will improve  Medication  Management: RN will administer medications as ordered by provider, will assess and evaluate patient's response and provide education to patient for prescribed medication. RN will report any adverse and/or side effects to prescribing provider.  Therapeutic Interventions: 1 on 1 counseling sessions, Psychoeducation, Medication administration, Evaluate responses to treatment, Monitor vital signs and CBGs as ordered, Perform/monitor CIWA, COWS, AIMS and Fall Risk screenings as ordered, Perform wound care treatments as ordered.  Evaluation of Outcomes: Adequate for Discharge   LCSW Treatment Plan for Primary Diagnosis: MDD (major depressive disorder), recurrent, severe, with psychosis (HCC) Long Term Goal(s): Safe transition to appropriate next level of care at discharge, Engage patient in therapeutic group addressing interpersonal concerns.  Short Term Goals: Engage patient in aftercare planning with referrals and resources, Facilitate acceptance of mental health diagnosis and concerns, Facilitate patient progression through stages of change regarding substance use diagnoses and concerns and Increase skills for wellness and recovery  Therapeutic Interventions: Assess for all discharge needs, 1 to 1 time with Social worker, Explore available resources and support systems, Assess for adequacy in community support network, Educate family and significant other(s) on suicide prevention, Complete Psychosocial Assessment, Interpersonal group therapy.  Evaluation of Outcomes: Adequate for Discharge   Progress in Treatment: Attending groups: Yes. Participating in groups: No. Taking medication as prescribed: Yes. Toleration medication: Yes. Family/Significant other contact made: Yes, individual(s) contacted:  Ted Mcalpine Patient understands diagnosis: Yes. Discussing patient identified problems/goals with staff: Yes. Medical problems stabilized or resolved: Yes. Denies suicidal/homicidal ideation:  Yes. Issues/concerns per patient self-inventory: No. Other: n/a  New problem(s) identified: No, Describe:  none  New  Short Term/Long Term Goal(s): medication management for mood stabilization;  development of comprehensive mental wellness/sobriety plan.   Patient Goals:  "To be able to get out and live a productive life"  Discharge Plan or Barriers: SPE pamphlet, Mobile Crisis information, and AA/NA information provided to patient for additional community support and resources. Pt has an appointment with [facility] on 12/13/2018 at 9:45AM for peer support services.   Reason for Continuation of Hospitalization: none  Estimated Length of Stay: Today 12/07/2018  Attendees: Patient: Lory Galan 12/07/2018 11:13 AM  Physician: Dr Jennet Maduro MD 12/07/2018 11:13 AM  Nursing: Mickeal Needy RN 12/07/2018 11:13 AM  RN Care Manager: 12/07/2018 11:13 AM  Social Worker: Iris Pert LCSW 12/07/2018 11:13 AM  Recreational Therapist: Garret Reddish CTRS LRT 12/07/2018 11:13 AM  Other: Lowella Dandy LCSW 12/07/2018 11:13 AM  Other:  12/07/2018 11:13 AM  Other: 12/07/2018 11:13 AM    Scribe for Treatment Team: Charlann Lange Saxon Barich, LCSW 12/07/2018 11:13 AM

## 2018-12-07 NOTE — [facility] Suicide Risk Assessment (Signed)
[facility] INPATIENT:  Family/Significant Other Suicide Prevention Education  Suicide Prevention Education:  Education Completed; Ted Mcalpine [phone] has been identified by the patient as the family member/significant other with whom the patient will be residing, and identified as the person(s) who will aid the patient in the event of a mental health crisis (suicidal ideations/suicide attempt).  With written consent from the patient, the family member/significant other has been provided the following suicide prevention education, prior to the and/or following the discharge of the patient.  The suicide prevention education provided includes the following:  Suicide risk factors  Suicide prevention and interventions  National Suicide Hotline telephone number  [facility] assessment telephone number  [facility] Emergency Assistance 911  [facility] and/or Residential Mobile Crisis Unit telephone number  Request made of family/significant other to:  Remove weapons (e.g., guns, rifles, knives), all items previously/currently identified as safety concern.    Remove drugs/medications (over-the-counter, prescriptions, illicit drugs), all items previously/currently identified as a safety concern.  The family member/significant other verbalizes understanding of the suicide prevention education information provided.  The family member/significant other agrees to remove the items of safety concern listed above.  Suzette Battiest reported that pt got to the hospital due to drugs and depression. Suzette Battiest denied any knowledge of guns or weapons in the home and stated that she has no concerns for SI or with pt returning to his living situation. Suzette Battiest reported that she would be picking pt up a little after 12pm today.  Charlann Lange Kortney Potvin MSW LCSW 12/07/2018, 11:09 AM

## 2018-12-07 NOTE — [facility] Suicide Risk Assessment (Signed)
[facility] Discharge Suicide Risk Assessment   Principal Problem: MDD (major depressive disorder), recurrent, severe, with psychosis (HCC) Discharge Diagnoses: Principal Problem:   MDD (major depressive disorder), recurrent, severe, with psychosis (HCC)   Total Time spent with patient: 20 minutes  Musculoskeletal: Strength & Muscle Tone: within normal limits Gait & Station: normal Patient leans: N/A  Psychiatric Specialty Exam: Review of Systems  Neurological: Negative.   All other systems reviewed and are negative.   Blood pressure 134/87, pulse 76, temperature 98.1 F (36.7 C), temperature source Oral, resp. rate 18, height 6\' 4"  (1.93 m), weight 132 kg, SpO2 99 %.Body mass index is 35.42 kg/m.  General Appearance: Casual  Eye Contact::  Good  Speech:  Clear and Coherent409  Volume:  Normal  Mood:  Euthymic  Affect:  Appropriate  Thought Process:  Goal Directed and Descriptions of Associations: Intact  Orientation:  Full (Time, Place, and Person)  Thought Content:  WDL  Suicidal Thoughts:  No  Homicidal Thoughts:  No  Memory:  Immediate;   Fair Recent;   Fair Remote;   Fair  Judgement:  Impaired  Insight:  Shallow  Psychomotor Activity:  Normal  Concentration:  Fair  Recall:  [employer] of Knowledge:Fair  Language: Fair  Akathisia:  No  Handed:  Right  AIMS (if indicated):     Assets:  Communication Skills Desire for Improvement Housing Physical Health Resilience Social Support  Sleep:  Number of Hours: 8.15  Cognition: WNL  ADL's:  Intact   Mental Status Per Nursing Assessment::   On Admission:  Suicide plan  Demographic Factors:  Male and Unemployed  Loss Factors: Financial problems/change in socioeconomic status  Historical Factors: Family history of suicide  Risk Reduction Factors:   Sense of responsibility to family, Living with another person, especially a relative and Positive social support  Continued Clinical Symptoms:  Depression:    Comorbid alcohol abuse/dependence Alcohol/Substance Abuse/Dependencies  Cognitive Features That Contribute To Risk:  None    Suicide Risk:  Minimal: No identifiable suicidal ideation.  Patients presenting with no risk factors but with morbid ruminations; may be classified as minimal risk based on the severity of the depressive symptoms  Follow-up Information    [facility] Follow up on 12/12/2018.   Why:  Unk Pinto will pick you up Monday 12/12/2018 at 7:15AM for peer support services. Thank You! Contact information: [address] [place] [address] [phone]           Plan Of Care/Follow-up recommendations:  Activity:  as tolerated Diet:  low sodium heart healthy ADA diet Other:  keep follow up appointments  Kristine Linea, MD 12/07/2018, 11:05 AM

## 2018-12-07 NOTE — Progress Notes (Signed)
  [facility] Adult Case Management Discharge Plan :  Will you be returning to the same living situation after discharge:  Yes,  with daughter At discharge, do you have transportation home?: Yes,  Suzette Battiest Tapp will pick him up around 12PM Do you have the ability to pay for your medications: Yes,  [facility]  Release of information consent forms completed and in the chart;   Patient to Follow up at: Follow-up Information    [facility] Follow up on 12/13/2018.   Why:  Unk Pinto will pick you up Tuesday 12/13/2018 at 9:45AM for peer support services. Thank You! Contact information: [address] [place] [address] [phone]           Next level of care provider has access to [facility] Link:no  Safety Planning and Suicide Prevention discussed: Yes,  SPE completed with pt and Veronica Tapp  Have you used any form of tobacco in the last 30 days? (Cigarettes, Smokeless Tobacco, Cigars, and/or Pipes): No  Has patient been referred to the Quitline?: N/A patient is not a smoker  Patient has been referred for addiction treatment: Yes  Mechele Dawley, LCSW 12/07/2018, 11:17 AM
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
## 2018-12-07 NOTE — Progress Notes (Signed)
Recreation Therapy Notes  Date: 12/07/2018  Time: 9:30 am   Location: Craft room   Behavioral response: N/A   Intervention Topic: Coping Skills  Discussion/Intervention: Patient did not attend group.   Clinical Observations/Feedback:  Patient did not attend group.   Adelae Yodice LRT/CTRS         Ferris Fielden 12/07/2018 11:39 AM 

## 2019-02-08 ENCOUNTER — Telehealth: Payer: Self-pay | Admitting: Pharmacy Technician

## 2019-02-08 NOTE — Telephone Encounter (Signed)
Patient failed to provide requested financial documentation. Financial documentation is required in order to determine patient's eligibility for [facility]'s program. No additional medication assistance will be provided until patient provides requested financial documentation. Patient notified by letter.  Kierstyn Baranowski CPhT/Eligibility Specialist [facility]  

## 2019-12-07 ENCOUNTER — Other Ambulatory Visit: Payer: Self-pay

## 2019-12-07 ENCOUNTER — Emergency Department: Payer: Self-pay

## 2019-12-07 ENCOUNTER — Emergency Department
Admission: EM | Admit: 2019-12-07 | Discharge: 2019-12-07 | Disposition: A | Discharge status: Home / Self Care | Payer: Self-pay | Attending: Emergency Medicine | Admitting: Emergency Medicine

## 2019-12-07 DIAGNOSIS — W19XXXA Unspecified fall, initial encounter: Secondary | ICD-10-CM | POA: Insufficient documentation

## 2019-12-07 DIAGNOSIS — I10 Essential (primary) hypertension: Secondary | ICD-10-CM | POA: Insufficient documentation

## 2019-12-07 DIAGNOSIS — Y998 Other external cause status: Secondary | ICD-10-CM | POA: Insufficient documentation

## 2019-12-07 DIAGNOSIS — Z87891 Personal history of nicotine dependence: Secondary | ICD-10-CM | POA: Insufficient documentation

## 2019-12-07 DIAGNOSIS — Y929 Unspecified place or not applicable: Secondary | ICD-10-CM | POA: Insufficient documentation

## 2019-12-07 DIAGNOSIS — Y9389 Activity, other specified: Secondary | ICD-10-CM | POA: Insufficient documentation

## 2019-12-07 DIAGNOSIS — S63632A Sprain of interphalangeal joint of right middle finger, initial encounter: Secondary | ICD-10-CM | POA: Insufficient documentation

## 2019-12-07 LAB — GLUCOSE, CAPILLARY: Glucose-Capillary: 124 mg/dL — ABNORMAL HIGH (ref 70–99)

## 2019-12-07 MED ORDER — METFORMIN HCL 500 MG PO TABS: 500.0000 mg | ORAL_TABLET | Freq: Two times a day (BID) | ORAL | 3 refills | Status: DC

## 2019-12-07 MED ORDER — ENALAPRIL MALEATE 5 MG PO TABS: 5.0000 mg | ORAL_TABLET | Freq: Every day | ORAL | 3 refills | Status: DC

## 2019-12-07 NOTE — ED Provider Notes (Signed)
[facility] Emergency Department Provider Note ____________________________________________  Time seen: 1206  I have reviewed the triage vital signs and the nursing notes.  HISTORY  Chief Complaint  Finger Injury  HPI Cameron Prince is a 49 y.o. male presents himself to the ED for evaluation of right middle finger pain and disability. He recalls that 3 days ago, he fell due to alcohol intoxication. He reported pain and swelling to the fingertip. He thought he had "jammed" his finger. He has been pulling on the finger since that time. He presents today with pain, swelling, and disability to the DIP. He has a history of HTN and T2DM, and admits to non-compliance with prescribed meds, due to unwanted side effects.   Past Medical History:  Diagnosis Date  . Hypertension     Patient Active Problem List   Diagnosis Date Noted  . MDD (major depressive disorder), recurrent, severe, with psychosis ([place]) 12/05/2018  . Suicidal thoughts 12/05/2018  . Cannabis use disorder, mild, abuse 12/05/2018  . Cocaine use disorder, moderate, dependence ([place]) 12/05/2018    Past Surgical History:  Procedure Laterality Date  . ABDOMINAL SURGERY      Prior to Admission medications   Medication Sig Start Date End Date Taking? Authorizing Provider  enalapril (VASOTEC) 5 MG tablet Take 1 tablet (5 mg total) by mouth daily. 12/07/19 04/05/20  Vicki Chaffin, Dannielle Karvonen, PA-C  metFORMIN (GLUCOPHAGE) 500 MG tablet Take 1 tablet (500 mg total) by mouth 2 (two) times daily with a meal. 12/07/19 04/05/20  Rayn Enderson, Dannielle Karvonen, PA-C  mirtazapine (REMERON) 15 MG tablet Take 1 tablet (15 mg total) by mouth at bedtime. 12/07/18   Pucilowska, Jolanta B, MD  PARoxetine (PAXIL) 30 MG tablet Take 1 tablet (30 mg total) by mouth daily. 12/08/18   Pucilowska, Wardell Honour, MD  traZODone (DESYREL) 50 MG tablet Take 1 tablet (50 mg total) by mouth at bedtime as needed and may repeat dose one time if needed for sleep.  12/07/18   Pucilowska, Wardell Honour, MD    Allergies Lisinopril  History reviewed. No pertinent family history.  Social History Social History   Tobacco Use  . Smoking status: Never Smoker  . Smokeless tobacco: Never Used  Substance Use Topics  . Alcohol use: Not Currently  . Drug use: Not on file    Review of Systems  Constitutional: Negative for fever. Cardiovascular: Negative for chest pain. Respiratory: Negative for shortness of breath. Musculoskeletal: Negative for back pain. Right middle finger pain & stiffness Skin: Negative for rash. Neurological: Negative for headaches, focal weakness or numbness. ____________________________________________  PHYSICAL EXAM:  VITAL SIGNS: ED Triage Vitals  Enc Vitals Group     BP 12/07/19 1155 (!) 143/81     Pulse Rate 12/07/19 1155 80     Resp 12/07/19 1155 18     Temp 12/07/19 1155 98.5 F (36.9 C)     Temp Source 12/07/19 1155 Oral     SpO2 12/07/19 1155 94 %     Weight 12/07/19 1154 260 lb (117.9 kg)     Height 12/07/19 1154 6\' 4"  (1.93 m)     Head Circumference --      Peak Flow --      Pain Score 12/07/19 1154 8     Pain Loc --      Pain Edu? --      Excl. in [place]? --     Constitutional: Alert and oriented. Well appearing and in no distress. Head: Normocephalic  and atraumatic. Eyes: Conjunctivae are normal. Normal extraocular movements Cardiovascular: Normal rate, regular rhythm. Normal distal pulses. Respiratory: Normal respiratory effort. No wheezes/rales/rhonchi. Musculoskeletal: Right middle finger with STS noted to the PIP. Decreased flexion ROM at the DIP. No nail deformity. Nontender with normal range of motion in all extremities.  Neurologic:  Normal gross sensation. Normal speech and language. No gross focal neurologic deficits are appreciated. Skin:  Skin is warm, dry and intact. No rash noted. Psychiatric: Mood and affect are normal. Patient exhibits appropriate insight and  judgment. ____________________________________________   LABS (pertinent positives/negatives) Labs Reviewed  GLUCOSE, CAPILLARY - Abnormal; Notable for the following components:      Result Value   Glucose-Capillary 124 (*)    All other components within normal limits  CBG MONITORING, ED  ____________________________________________   RADIOLOGY  DG Right Middle Finger IMPRESSION: No acute osseous injury of the right third digit.  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  I, Lissa Hoard, personally viewed and evaluated these images (plain radiographs) as part of my medical decision making, as well as reviewing the written report by the radiologist.  ____________________________________________  PROCEDURES  Finger splint  Procedures ____________________________________________  INITIAL IMPRESSION / ASSESSMENT AND PLAN / ED COURSE  Patient with ED evaluation of acute injury to the right middle finger after mechanical fall.  Patient's exam is negative for any acute fracture or dislocation.  X-ray reveals small osteophyte which is probably related to a remote injury.  His finger will be splinted and buddy taped for comfort.  He is discharged with instructions to take over-the-counter Tylenol for pain relief.  Courtesy refill of his previous blood pressure and diabetes medicines are provided.  Return precautions have been reviewed.  Draper Gallon was evaluated in Emergency Department on 12/07/2019 for the symptoms described in the history of present illness. He was evaluated in the context of the global COVID-19 pandemic, which necessitated consideration that the patient might be at risk for infection with the SARS-CoV-2 virus that causes COVID-19. Institutional protocols and algorithms that pertain to the evaluation of patients at risk for COVID-19 are in a state of rapid change based on  information released by regulatory bodies including the CDC and federal and state organizations. These policies and algorithms were followed during the patient's care in the ED. ____________________________________________  FINAL CLINICAL IMPRESSION(S) / ED DIAGNOSES  Final diagnoses:  Sprain of interphalangeal joint of right middle finger, initial encounter      Lissa Hoard, PA-C 12/07/19 1404    Concha Se, MD 12/10/19 [phone]

## 2019-12-07 NOTE — Discharge Instructions (Addendum)
Your exam and x-ray are negative for a fracture to the finger. You do have a small bone spur, which is older. You will be treated for a sprained finger. Wear the finger splint as needed for comfort.  You are also having your Diabetes and Hypertension meds refilled. Follow-up with one of the local community clinics for medication management.

## 2019-12-07 NOTE — ED Triage Notes (Signed)
Pt hit right middle finger on something the other day. Swollen, stiff, painful.

## 2020-04-10 ENCOUNTER — Telehealth: Payer: Self-pay | Admitting: General Practice

## 2020-04-10 NOTE — Telephone Encounter (Signed)
Individual has been contacted regarding ED referral. The phone number on file is not in service. No further attempts will be made to contact the individual regarding the ED referral.

## 2020-05-20 ENCOUNTER — Ambulatory Visit: Payer: Self-pay | Attending: Internal Medicine

## 2020-05-20 DIAGNOSIS — Z23 Encounter for immunization: Secondary | ICD-10-CM

## 2020-05-20 NOTE — Progress Notes (Signed)
   Covid-19 Vaccination Clinic  Name:  Gunter Conde    MRN: [id number] DOB: 04-Sep-1971  05/20/2020  Mr. Downs was observed post Covid-19 immunization for 15 minutes without incident. He was provided with Vaccine Information Sheet and instruction to access the V-Safe system.   Mr. Kautzman was instructed to call 911 with any severe reactions post vaccine: Marland Kitchen Difficulty breathing  . Swelling of face and throat  . A fast heartbeat  . A bad rash all over body  . Dizziness and weakness   Immunizations Administered    Name Date Dose VIS Date Route   Pfizer COVID-19 Vaccine 05/20/2020  2:03 PM 0.3 mL 11/29/2018 Intramuscular   Manufacturer: [employer], [employer]   Lot: [id number]   NDC: [address]-5

## 2020-06-17 ENCOUNTER — Ambulatory Visit: Payer: Self-pay

## 2021-04-21 IMAGING — DX DG FINGER MIDDLE 2+V*R*
3 series · 3 of 3 positions shown · non-contrast
Comparison: None.

CLINICAL DATA: Pain and stiffness

EXAM:
RIGHT MIDDLE FINGER 2+V

[finger ap]
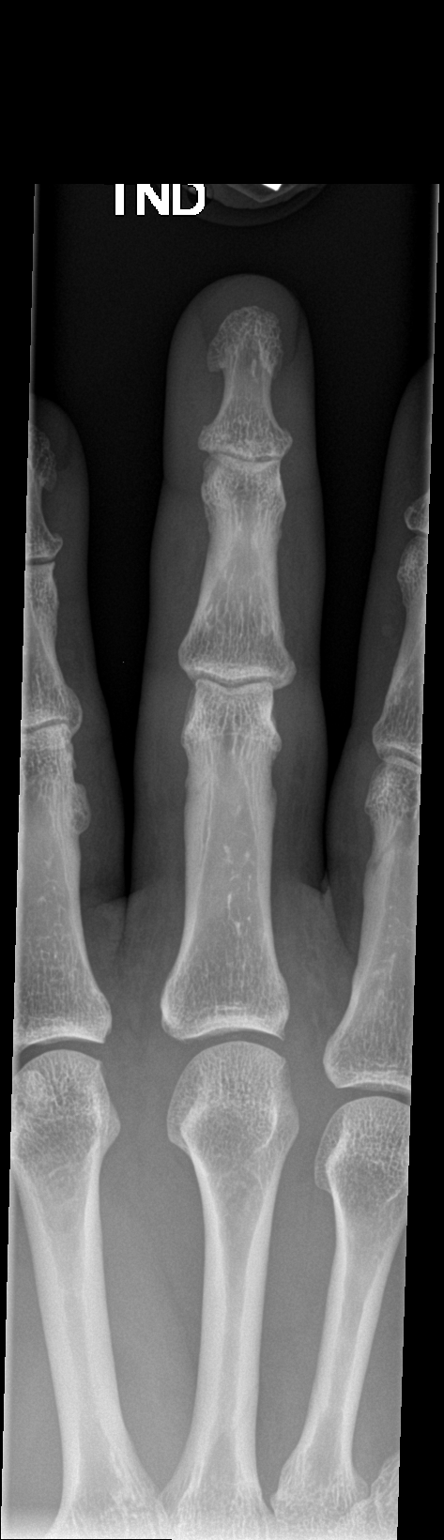

[finger obl]
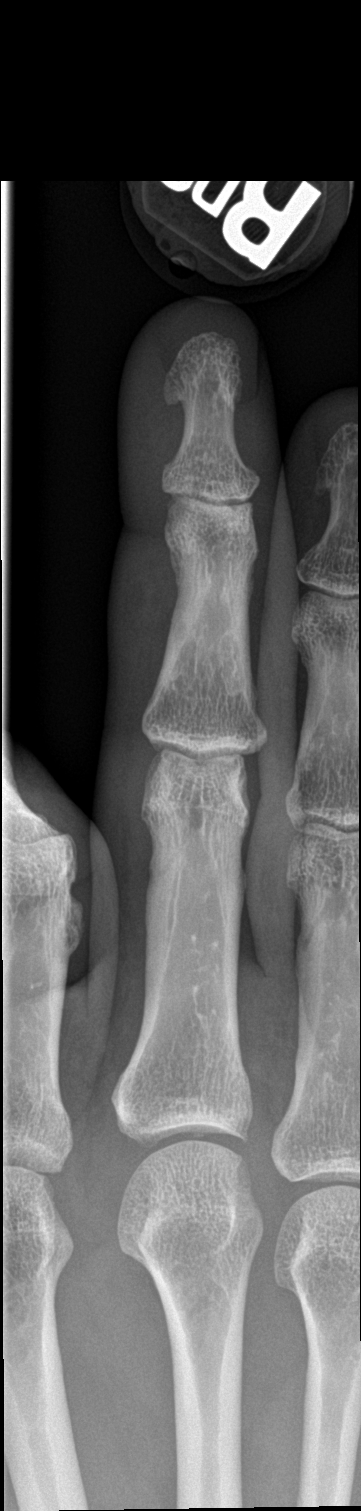

[finger lat]
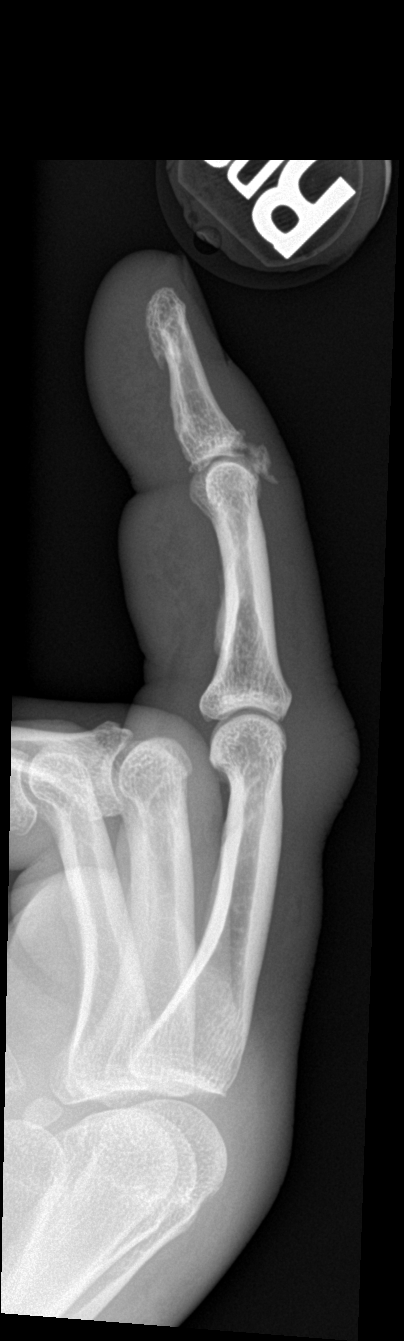

[3 of 3 positions shown; findings below may reference images not displayed]

FINDINGS: No acute fracture or dislocation. No aggressive osseous lesion. Mild
osteoarthritis of the third DIP joint. Old ununited avulsion
fracture of the dorsal base of the third distal phalanx. No soft
tissue abnormality.
IMPRESSION: No acute osseous injury of the right third digit.

Mild osteoarthritis of the third DIP joint. Old ununited avulsion
fracture of the dorsal base of the third distal phalanx.

## 2022-06-12 ENCOUNTER — Emergency Department: Payer: Self-pay

## 2022-06-12 ENCOUNTER — Other Ambulatory Visit: Payer: Self-pay

## 2022-06-12 ENCOUNTER — Emergency Department
Admission: EM | Admit: 2022-06-12 | Discharge: 2022-06-12 | Disposition: A | Discharge status: Home / Self Care | Payer: Self-pay | Attending: Emergency Medicine | Admitting: Emergency Medicine

## 2022-06-12 DIAGNOSIS — W19XXXA Unspecified fall, initial encounter: Secondary | ICD-10-CM

## 2022-06-12 DIAGNOSIS — S63501A Unspecified sprain of right wrist, initial encounter: Secondary | ICD-10-CM | POA: Insufficient documentation

## 2022-06-12 DIAGNOSIS — W1830XA Fall on same level, unspecified, initial encounter: Secondary | ICD-10-CM | POA: Insufficient documentation

## 2022-06-12 DIAGNOSIS — I10 Essential (primary) hypertension: Secondary | ICD-10-CM | POA: Insufficient documentation

## 2022-06-12 MED ORDER — HYDROCODONE-ACETAMINOPHEN 5-325 MG PO TABS
1.0000 | ORAL_TABLET | Freq: Once | ORAL | Status: AC
  Administered 2022-06-12: 1 via ORAL
  Filled 2022-06-12: qty 1

## 2022-06-12 MED ORDER — HYDROCODONE-ACETAMINOPHEN 5-325 MG PO TABS: 1.0000 | ORAL_TABLET | Freq: Four times a day (QID) | ORAL | 0 refills | Status: DC | PRN

## 2022-06-12 NOTE — Discharge Instructions (Signed)
Follow-up with Dr. Joice Lofts who is on-call for orthopedics if not improving or any worsening of your wrist pain symptoms.  Ice and elevation over the weekend.  A prescription for pain medication was sent to the pharmacy to be taken only as directed every 6 hours as needed.  Be aware that this medication could cause drowsiness and increase your risk for falling.  You may also take ibuprofen with this medication if additional pain medicine as needed.

## 2022-06-12 NOTE — ED Triage Notes (Signed)
Pt comes with co right wrist pain from fall today. Pt states he thinks he fractured it.

## 2022-06-12 NOTE — ED Notes (Signed)
See triage note  Presents s/p fall  States he lost his balance and fell   Landed with right arm out  Having pain and some swelling to right wrist area

## 2022-06-12 NOTE — ED Provider Notes (Signed)
[facility] Provider Note    Event Date/Time   First MD Initiated Contact with Patient 06/12/22 1148     (approximate)   History   Wrist Pain   HPI  Cameron Prince is a 51 y.o. male   presents to the ED with complaint of right wrist pain after a fall today in which he states he fell backwards on his outstretched hand.  Patient denies any head injury or loss of consciousness and this was a mechanical fall.  Patient believes that he fractured his wrist.  He has no other complaints.  Has a history of hypertension, depressive disorder with psychosis, cannabis and cocaine use.      Physical Exam   Triage Vital Signs: ED Triage Vitals  Enc Vitals Group     BP 06/12/22 1035 (!) 146/86     Pulse Rate 06/12/22 1035 92     Resp 06/12/22 1035 18     Temp 06/12/22 1035 98.1 F (36.7 C)     Temp Source 06/12/22 1035 Oral     SpO2 06/12/22 1035 96 %     Weight --      Height --      Head Circumference --      Peak Flow --      Pain Score 06/12/22 1031 10     Pain Loc --      Pain Edu? --      Excl. in [place]? --     Most recent vital signs: Vitals:   06/12/22 1035  BP: (!) 146/86  Pulse: 92  Resp: 18  Temp: 98.1 F (36.7 C)  SpO2: 96%     General: Awake, no distress.  Alert and talkative. CV:  Good peripheral perfusion.  Resp:  Normal effort.  Abd:  No distention.  Other:  Right wrist with moderate tenderness on palpation.  There is soft tissue edema present but no deformity.  Range of motion is restricted secondary to pain.  Skin is intact.  Radial pulse present.  Motor or sensory function intact distal to the injury.  No other bony tenderness on palpation.   ED Results / Procedures / Treatments   Labs (all labs ordered are listed, but only abnormal results are displayed) Labs Reviewed - No data to display   RADIOLOGY  Right wrist x-ray images were reviewed and interpreted by me as negative for acute bony abnormality.  Radiology report is  reviewed and states no fracture or dislocation.   PROCEDURES:  Critical Care performed:   Procedures   MEDICATIONS ORDERED IN ED: Medications  HYDROcodone-acetaminophen (NORCO/VICODIN) 5-325 MG per tablet 1 tablet (1 tablet Oral Given 06/12/22 1205)     IMPRESSION / MDM / ASSESSMENT AND PLAN / ED COURSE  I reviewed the triage vital signs and the nursing notes.   Differential diagnosis includes, but is not limited to, right wrist pain, sprain, fracture, dislocation, contusion.  51 year old male presents to the ED with complaint of right wrist pain after he fell this morning which was mechanical fall.  He states this was the only injury that occurred.  He denies any head injury or loss of consciousness.  Physical exam is concerning for a fracture however x-rays showed no acute bony injury.  Ace wrap was applied and patient was made aware that he should ice and elevate his wrist over the weekend.  A prescription for hydrocodone was sent to the pharmacy to take as needed for pain.  He is to follow-up  with Dr. Joice Lofts who is on-call for orthopedics if his wrist does not improve or her symptoms get worse.      Patient's presentation is most consistent with acute complicated illness / injury requiring diagnostic workup.  FINAL CLINICAL IMPRESSION(S) / ED DIAGNOSES   Final diagnoses:  Sprain of right wrist, initial encounter  Fall, initial encounter     Rx / DC Orders   ED Discharge Orders          Ordered    HYDROcodone-acetaminophen (NORCO/VICODIN) 5-325 MG tablet  Every 6 hours PRN        06/12/22 1217             Note:  This document was prepared using Dragon voice recognition software and may include unintentional dictation errors.   Tommi Rumps, PA-C 06/12/22 1221    Chesley Noon, MD 06/12/22 [phone]

## 2022-07-26 ENCOUNTER — Emergency Department: Payer: Commercial Managed Care - HMO

## 2022-07-26 ENCOUNTER — Inpatient Hospital Stay
Admission: EM | Admit: 2022-07-26 | Discharge: 2022-07-31 | DRG: 272 | Disposition: A | Discharge status: Home / Self Care | Payer: Commercial Managed Care - HMO | Attending: Family Medicine | Admitting: Family Medicine

## 2022-07-26 ENCOUNTER — Other Ambulatory Visit: Payer: Self-pay

## 2022-07-26 ENCOUNTER — Inpatient Hospital Stay: Payer: Commercial Managed Care - HMO

## 2022-07-26 DIAGNOSIS — E119 Type 2 diabetes mellitus without complications: Secondary | ICD-10-CM | POA: Diagnosis present

## 2022-07-26 DIAGNOSIS — Z833 Family history of diabetes mellitus: Secondary | ICD-10-CM

## 2022-07-26 DIAGNOSIS — F17210 Nicotine dependence, cigarettes, uncomplicated: Secondary | ICD-10-CM | POA: Diagnosis present

## 2022-07-26 DIAGNOSIS — I82621 Acute embolism and thrombosis of deep veins of right upper extremity: Secondary | ICD-10-CM | POA: Diagnosis present

## 2022-07-26 DIAGNOSIS — F329 Major depressive disorder, single episode, unspecified: Secondary | ICD-10-CM | POA: Diagnosis present

## 2022-07-26 DIAGNOSIS — Z8249 Family history of ischemic heart disease and other diseases of the circulatory system: Secondary | ICD-10-CM

## 2022-07-26 DIAGNOSIS — Z6833 Body mass index (BMI) 33.0-33.9, adult: Secondary | ICD-10-CM

## 2022-07-26 DIAGNOSIS — I159 Secondary hypertension, unspecified: Secondary | ICD-10-CM

## 2022-07-26 DIAGNOSIS — Z79899 Other long term (current) drug therapy: Secondary | ICD-10-CM | POA: Diagnosis not present

## 2022-07-26 DIAGNOSIS — I82C11 Acute embolism and thrombosis of right internal jugular vein: Secondary | ICD-10-CM | POA: Diagnosis present

## 2022-07-26 DIAGNOSIS — I82B11 Acute embolism and thrombosis of right subclavian vein: Secondary | ICD-10-CM | POA: Diagnosis present

## 2022-07-26 DIAGNOSIS — R591 Generalized enlarged lymph nodes: Secondary | ICD-10-CM

## 2022-07-26 DIAGNOSIS — Z7984 Long term (current) use of oral hypoglycemic drugs: Secondary | ICD-10-CM

## 2022-07-26 DIAGNOSIS — I82409 Acute embolism and thrombosis of unspecified deep veins of unspecified lower extremity: Secondary | ICD-10-CM | POA: Diagnosis present

## 2022-07-26 DIAGNOSIS — Z597 Insufficient social insurance and welfare support: Secondary | ICD-10-CM

## 2022-07-26 DIAGNOSIS — Z1152 Encounter for screening for COVID-19: Secondary | ICD-10-CM

## 2022-07-26 DIAGNOSIS — R911 Solitary pulmonary nodule: Secondary | ICD-10-CM | POA: Diagnosis present

## 2022-07-26 DIAGNOSIS — Z888 Allergy status to other drugs, medicaments and biological substances status: Secondary | ICD-10-CM

## 2022-07-26 DIAGNOSIS — I82290 Acute embolism and thrombosis of other thoracic veins: Secondary | ICD-10-CM | POA: Diagnosis present

## 2022-07-26 DIAGNOSIS — I10 Essential (primary) hypertension: Secondary | ICD-10-CM | POA: Diagnosis present

## 2022-07-26 DIAGNOSIS — E669 Obesity, unspecified: Secondary | ICD-10-CM | POA: Diagnosis present

## 2022-07-26 DIAGNOSIS — F32A Depression, unspecified: Secondary | ICD-10-CM

## 2022-07-26 LAB — CBC
HCT: 48 % (ref 39.0–52.0)
Hemoglobin: 15.1 g/dL (ref 13.0–17.0)
MCH: 27.2 pg (ref 26.0–34.0)
MCHC: 31.5 g/dL (ref 30.0–36.0)
MCV: 86.5 fL (ref 80.0–100.0)
Platelets: 315 10*3/uL (ref 150–400)
RBC: 5.55 MIL/uL (ref 4.22–5.81)
RDW: 14.5 % (ref 11.5–15.5)
WBC: 10 10*3/uL (ref 4.0–10.5)
nRBC: 0 % (ref 0.0–0.2)

## 2022-07-26 LAB — BASIC METABOLIC PANEL
Anion gap: 6 (ref 5–15)
BUN: 12 mg/dL (ref 6–20)
CO2: 25 mmol/L (ref 22–32)
Calcium: 8.9 mg/dL (ref 8.9–10.3)
Chloride: 108 mmol/L (ref 98–111)
Creatinine, Ser: 1.21 mg/dL (ref 0.61–1.24)
GFR, Estimated: >60 mL/min (ref >60)
Glucose, Bld: 138 mg/dL — ABNORMAL HIGH (ref 70–99)
Potassium: 3.6 mmol/L (ref 3.5–5.1)
Sodium: 139 mmol/L (ref 135–145)

## 2022-07-26 LAB — RESP PANEL BY RT-PCR (FLU A&B, COVID) ARPGX2
Influenza A by PCR: NEGATIVE
Influenza B by PCR: NEGATIVE
SARS Coronavirus 2 by RT PCR: NEGATIVE

## 2022-07-26 LAB — APTT: aPTT: 32 seconds (ref 24–36)

## 2022-07-26 LAB — GROUP A STREP BY PCR: Group A Strep by PCR: NOT DETECTED

## 2022-07-26 LAB — CBG MONITORING, ED: Glucose-Capillary: 160 mg/dL — ABNORMAL HIGH (ref 70–99)

## 2022-07-26 MED ORDER — HEPARIN (PORCINE) 25000 UT/250ML-% IV SOLN
1900.0000 [IU]/h | INTRAVENOUS | Status: DC
  Administered 2022-07-26: 1900 [IU]/h via INTRAVENOUS
  Filled 2022-07-26: qty 250

## 2022-07-26 MED ORDER — SODIUM CHLORIDE 0.9% FLUSH
3.0000 mL | Freq: Two times a day (BID) | INTRAVENOUS | Status: DC
  Administered 2022-07-27 – 2022-07-30 (×3): 3 mL via INTRAVENOUS

## 2022-07-26 MED ORDER — ACETAMINOPHEN 650 MG RE SUPP: 650.0000 mg | Freq: Four times a day (QID) | RECTAL | Status: DC | PRN

## 2022-07-26 MED ORDER — NICOTINE 7 MG/24HR TD PT24
7.0000 mg | MEDICATED_PATCH | Freq: Every day | TRANSDERMAL | Status: DC
  Administered 2022-07-27: 7 mg via TRANSDERMAL
  Filled 2022-07-26 (×5): qty 1

## 2022-07-26 MED ORDER — HEPARIN BOLUS VIA INFUSION
7500.0000 [IU] | Freq: Once | INTRAVENOUS | Status: AC
  Administered 2022-07-26: 7500 [IU] via INTRAVENOUS
  Filled 2022-07-26: qty 7500

## 2022-07-26 MED ORDER — IOHEXOL 300 MG/ML  SOLN
75.0000 mL | Freq: Once | INTRAMUSCULAR | Status: AC | PRN
  Administered 2022-07-26: 75 mL via INTRAVENOUS

## 2022-07-26 MED ORDER — INSULIN ASPART 100 UNIT/ML IJ SOLN: 0.0000 [IU] | Freq: Every day | INTRAMUSCULAR | Status: DC

## 2022-07-26 MED ORDER — ACETAMINOPHEN 325 MG PO TABS
650.0000 mg | ORAL_TABLET | Freq: Four times a day (QID) | ORAL | Status: DC | PRN
  Administered 2022-07-27 – 2022-07-30 (×4): 650 mg via ORAL
  Filled 2022-07-26 (×5): qty 2

## 2022-07-26 MED ORDER — INSULIN ASPART 100 UNIT/ML IJ SOLN
0.0000 [IU] | Freq: Three times a day (TID) | INTRAMUSCULAR | Status: DC
  Administered 2022-07-27 – 2022-07-28 (×2): 2 [IU] via SUBCUTANEOUS
  Filled 2022-07-26 (×2): qty 1

## 2022-07-26 NOTE — Assessment & Plan Note (Signed)
-   currently BP stable; reports he was only on HCTZ but has not taken since release - monitor for now and can resume treatment if needed
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
## 2022-07-26 NOTE — ED Triage Notes (Signed)
Pt presents via POV c/o left sided neck swelling and pain for a couple of days. Ambulatory to triage. Denies difficulty breathing. Denies fevers. Area is swollen and painful to touch superior to right clavicle.
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
## 2022-07-26 NOTE — Assessment & Plan Note (Addendum)
-   tobacco use history; patient reports only 1-2 cigarettes daily since early 45s - no know FH of cancers - CT chest shows 2.4 cm LUL nodule as well as right supraclavicular enlarged lymph nodes measuring up to 1.5 cm; discussed personally with patient beside in ER and he endorsed understanding and concern for malignancy and warranting further workup -Given lack of outpatient resources, would benefit from inpatient work-up if able

## 2022-07-26 NOTE — Assessment & Plan Note (Addendum)
-   extensive involving RUE from RIJ and involving subclavian, axillary, cephalic, basilic, brachial, radial, ulnar veins -Patient started on heparin drip in the ER -Per vascular, continue heparin unless develops worsening symptoms at which time could be considered for cath directed thrombolysis - etiology highly concerning for underlying malignant process given 2.4 cm LUL nodule and history of tobacco use (see lung nodule) - check LE duplex as well given recent hx LE swelling

## 2022-07-26 NOTE — ED Notes (Signed)
Lab notified of need for venipuncture assist for ptt

## 2022-07-26 NOTE — ED Notes (Signed)
Report to [employer], rn

## 2022-07-26 NOTE — Consult Note (Signed)
ANTICOAGULATION CONSULT NOTE - Initial Consult  Pharmacy Consult for heparin infusion Indication: DVT  Allergies  Allergen Reactions   Lisinopril Cough    Patient Measurements: Height: 6\' 4"  (193 cm) Weight: 125.6 kg (277 lb) IBW/kg (Calculated) : 86.8 Heparin Dosing Weight: 113.6kg  Vital Signs: Temp: 98.9 F (37.2 C) (10/22 1800) Temp Source: Oral (10/22 1800) BP: 131/85 (10/22 1523) Pulse Rate: 67 (10/22 1523)  Labs: Recent Labs    07/26/22 1250  HGB 15.1  HCT 48.0  PLT 315  CREATININE 1.21    Estimated Creatinine Clearance: 104.5 mL/min (by C-G formula based on SCr of 1.21 mg/dL).   Medical History: Past Medical History:  Diagnosis Date   Hypertension     Medications:  PTA: N/A Inpatient: Heparin infusion (10/22 >) Allergies: No AC/APT related allergies  Assessment: 51 year old male with PMH MDD, T2DM, hypertension, lung nodule presents to ED with rich neck pain and right arm swelling. On imaging, US doppler study showing occlusive deep venous thrombosis in the right upper extremity.  Goal of Therapy:  Heparin level 0.3-0.7 units/ml Monitor platelets by anticoagulation protocol: Yes  Date Time aPTT/HL Rate/Comment   Plan:  Order STAT aPTT Give 7500 units bolus x1; then start heparin infusion at 1900 units/hr Check anti-Xa level in 6 hours and daily once consecutively therapeutic. Continue to monitor H&H and platelets daily while on heparin gtt.    Darrick Penna 07/26/2022,7:18 PM

## 2022-07-26 NOTE — Assessment & Plan Note (Signed)
-   currently mood stable and pleasant - hx psych admission; patient reports no medications at this time - follow up med rec however, as some old meds listed

## 2022-07-26 NOTE — Hospital Course (Signed)
Mr. Zabriskie is a 51 yo male with PMH HTN, DMII, ongoing tobacco use who presented with approximately 2 day complaint of problem involving his right lateral neck and axilla.  He also noticed swelling involving this area extending down to his right hand.  A couple weeks ago he also endorsed having some swelling in his legs at home which resolved. He recently has been incarcerated (for 2 years), released in June.  He has not been working but remains fairly active at home including mowing the lawn and normal daily activities.  He continues to smoke approximately 1 to 2 cigarettes/day, he denies smoking much more than this.  Tobacco use history started approximately in his early 61s.  Occasional alcohol use and occasional marijuana use. He has not been on any medications since release but has been trying to obtain insurance and primary care. Family history includes deceased dad with only known diabetes and deceased mother from heart disease.  Brother and sister have no known medical problems.  He has noticed some nausea over the past 1 month at home.  Denied any fevers, chills, sweats.  No unintentional weight loss despite the nausea. He donates plasma approximately 2 times per week and IV access is always in his left arm.  Work-up in the ER included CT chest which revealed a prominent right subclavian vein.  There was also a 2.4 cm pleural-based nodule in the left upper lobe concerning for underlying malignancy. Right upper extremity duplex was then obtained which showed significant clot extending from the right IJ and involving subclavian, axillary, cephalic, basilic, brachial, radial, ulnar veins.  Case was discussed with vascular surgery from the ER with recommendations for continuing on heparin drip and monitoring for any worsening.  If does develop worsening, could be considered for catheter directed thrombolysis at that time.

## 2022-07-26 NOTE — Assessment & Plan Note (Signed)
-   Check A1c - Continue SSI and CBG monitoring for now - Continue carb consistent diet

## 2022-07-27 DIAGNOSIS — R911 Solitary pulmonary nodule: Secondary | ICD-10-CM | POA: Diagnosis not present

## 2022-07-27 DIAGNOSIS — I82621 Acute embolism and thrombosis of deep veins of right upper extremity: Secondary | ICD-10-CM | POA: Diagnosis not present

## 2022-07-27 LAB — CBC WITH DIFFERENTIAL/PLATELET
Abs Immature Granulocytes: 0.03 10*3/uL (ref 0.00–0.07)
Basophils Absolute: 0.1 10*3/uL (ref 0.0–0.1)
Basophils Relative: 1 %
Eosinophils Absolute: 0.3 10*3/uL (ref 0.0–0.5)
Eosinophils Relative: 3 %
HCT: 43.4 % (ref 39.0–52.0)
Hemoglobin: 13.6 g/dL (ref 13.0–17.0)
Immature Granulocytes: 0 %
Lymphocytes Relative: 29 %
Lymphs Abs: 2.6 10*3/uL (ref 0.7–4.0)
MCH: 27.1 pg (ref 26.0–34.0)
MCHC: 31.3 g/dL (ref 30.0–36.0)
MCV: 86.6 fL (ref 80.0–100.0)
Monocytes Absolute: 1.6 10*3/uL — ABNORMAL HIGH (ref 0.1–1.0)
Monocytes Relative: 17 %
Neutro Abs: 4.7 10*3/uL (ref 1.7–7.7)
Neutrophils Relative %: 50 %
Platelets: 294 10*3/uL (ref 150–400)
RBC: 5.01 MIL/uL (ref 4.22–5.81)
RDW: 14.4 % (ref 11.5–15.5)
WBC: 9.3 10*3/uL (ref 4.0–10.5)
nRBC: 0 % (ref 0.0–0.2)

## 2022-07-27 LAB — CBG MONITORING, ED
Glucose-Capillary: 115 mg/dL — ABNORMAL HIGH (ref 70–99)
Glucose-Capillary: 146 mg/dL — ABNORMAL HIGH (ref 70–99)
Glucose-Capillary: 84 mg/dL (ref 70–99)

## 2022-07-27 LAB — HEPARIN LEVEL (UNFRACTIONATED)
Heparin Unfractionated: >1.1 IU/mL — ABNORMAL HIGH (ref 0.30–0.70)
Heparin Unfractionated: 0.85 IU/mL — ABNORMAL HIGH (ref 0.30–0.70)
Heparin Unfractionated: 0.65 IU/mL (ref 0.30–0.70)

## 2022-07-27 LAB — GLUCOSE, CAPILLARY: Glucose-Capillary: 104 mg/dL — ABNORMAL HIGH (ref 70–99)

## 2022-07-27 LAB — MAGNESIUM: Magnesium: 1.9 mg/dL (ref 1.7–2.4)

## 2022-07-27 LAB — BASIC METABOLIC PANEL
Anion gap: 6 (ref 5–15)
BUN: 11 mg/dL (ref 6–20)
CO2: 28 mmol/L (ref 22–32)
Calcium: 8.6 mg/dL — ABNORMAL LOW (ref 8.9–10.3)
Chloride: 105 mmol/L (ref 98–111)
Creatinine, Ser: 1.23 mg/dL (ref 0.61–1.24)
GFR, Estimated: >60 mL/min (ref >60)
Glucose, Bld: 130 mg/dL — ABNORMAL HIGH (ref 70–99)
Potassium: 4.1 mmol/L (ref 3.5–5.1)
Sodium: 139 mmol/L (ref 135–145)

## 2022-07-27 LAB — HIV ANTIBODY (ROUTINE TESTING W REFLEX): HIV Screen 4th Generation wRfx: NONREACTIVE

## 2022-07-27 LAB — HEMOGLOBIN A1C
Hgb A1c MFr Bld: 5.5 % (ref 4.8–5.6)
Mean Plasma Glucose: 111.15 mg/dL

## 2022-07-27 MED ORDER — AMOXICILLIN-POT CLAVULANATE 875-125 MG PO TABS
1.0000 | ORAL_TABLET | Freq: Two times a day (BID) | ORAL | Status: DC
  Administered 2022-07-27 – 2022-07-31 (×8): 1 via ORAL
  Filled 2022-07-27 (×9): qty 1

## 2022-07-27 MED ORDER — HEPARIN (PORCINE) 25000 UT/250ML-% IV SOLN
1400.0000 [IU]/h | INTRAVENOUS | Status: DC
  Administered 2022-07-27: 1600 [IU]/h via INTRAVENOUS
  Administered 2022-07-28 – 2022-07-29 (×2): 1400 [IU]/h via INTRAVENOUS
  Filled 2022-07-27 (×4): qty 250

## 2022-07-27 NOTE — Consult Note (Signed)
ANTICOAGULATION CONSULT NOTE   Pharmacy Consult for heparin infusion Indication: DVT  Allergies  Allergen Reactions   Lisinopril Cough    Patient Measurements: Height: 6\' 4"  (193 cm) Weight: 125.6 kg (277 lb) IBW/kg (Calculated) : 86.8 Heparin Dosing Weight: 113.6kg  Vital Signs: Temp: 98.6 F (37 C) (10/23 1744) Temp Source: Oral (10/23 1744) BP: 132/80 (10/23 1744) Pulse Rate: 84 (10/23 1744)  Labs: Recent Labs    07/26/22 1250 07/26/22 1950 07/27/22 0155 07/27/22 1139 07/27/22 1841  HGB 15.1  --  13.6  --   --   HCT 48.0  --  43.4  --   --   PLT 315  --  294  --   --   APTT  --  32  --   --   --   HEPARINUNFRC  --   --  >1.10* 0.85* 0.65  CREATININE 1.21  --  1.23  --   --      Estimated Creatinine Clearance: 102.8 mL/min (by C-G formula based on SCr of 1.23 mg/dL).   Medical History: Past Medical History:  Diagnosis Date   Hypertension     Medications:  PTA: N/A Inpatient: Heparin infusion (10/22 >) Allergies: No AC/APT related allergies  Assessment: 51 year old male with PMH MDD, T2DM, hypertension, lung nodule presents to ED with rich neck pain and right arm swelling. On imaging, US doppler study showing occlusive deep venous thrombosis in the right upper extremity.  Goal of Therapy:  Heparin level 0.3-0.7 units/ml Monitor platelets by anticoagulation protocol: Yes  Date Time aPTT/HL Rate/Comment 10/23 0155 HL >1.1 Supratherapeutic  10/23 1139 HL 0.85 Supratherapeutic 10/23 1841 HL 0.65 Therapeutic x 1 @ 1400 units/hr  Plan:  Heparin therapeutic Continue heparin infusion at 1400 units/hr Recheck HL in 6 hr to confirm CBC daily while on heparin   Dorothe Pea, PharmD, BCPS Clinical Pharmacist   07/27/2022 7:03 PM

## 2022-07-27 NOTE — Consult Note (Signed)
ANTICOAGULATION CONSULT NOTE   Pharmacy Consult for heparin infusion Indication: DVT  Allergies  Allergen Reactions   Lisinopril Cough    Patient Measurements: Height: 6\' 4"  (193 cm) Weight: 125.6 kg (277 lb) IBW/kg (Calculated) : 86.8 Heparin Dosing Weight: 113.6kg  Vital Signs: Temp: 98.5 F (36.9 C) (10/23 1100) Temp Source: Oral (10/23 1100) BP: 136/74 (10/23 1100) Pulse Rate: 85 (10/23 1100)  Labs: Recent Labs    07/26/22 1250 07/26/22 1950 07/27/22 0155 07/27/22 1139  HGB 15.1  --  13.6  --   HCT 48.0  --  43.4  --   PLT 315  --  294  --   APTT  --  32  --   --   HEPARINUNFRC  --   --  >1.10* 0.85*  CREATININE 1.21  --  1.23  --      Estimated Creatinine Clearance: 102.8 mL/min (by C-G formula based on SCr of 1.23 mg/dL).   Medical History: Past Medical History:  Diagnosis Date   Hypertension     Medications:  PTA: N/A Inpatient: Heparin infusion (10/22 >) Allergies: No AC/APT related allergies  Assessment: 51 year old male with PMH MDD, T2DM, hypertension, lung nodule presents to ED with rich neck pain and right arm swelling. On imaging, US doppler study showing occlusive deep venous thrombosis in the right upper extremity.  Goal of Therapy:  Heparin level 0.3-0.7 units/ml Monitor platelets by anticoagulation protocol: Yes  Date Time aPTT/HL Rate/Comment 10/23 0155 HL >1.1 Supratherapeutic  10/23 1139 HL 0.85 Supratherapeutic  Plan:  Reduce heparin infusion to 1400 units/hr Recheck HL in 6 hr after rate change CBC daily while on heparin   Glean Salvo, PharmD Clinical Pharmacist  07/27/2022 12:24 PM
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
## 2022-07-27 NOTE — Consult Note (Signed)
NAME:  Cameron Prince, MRN:  [id number], DOB:  24-Apr-1971, LOS: 1 ADMISSION DATE:  07/26/2022, CONSULTATION DATE:  27 Jul 2022 REFERRING MD:  Jerald Kief Regalado,MD REASON FOR CONSULT: Lung nodule left upper lobe  History of Present Illness:  Patient is a 51 year old male current smoker (cigarettes/marijuana) who presented to [facility] on 26 July 2022 with a complaint of right neck and right arm pain with right arm swelling.  Recently released from prison in June.  Was incarcerated for 2 years.  Limited smoking history with regards to tobacco, does use marijuana.  Rare alcohol use.  He had a dry cough upon admission, 3 weeks duration, no fevers, chills or sweats.  No hemoptysis.  No weight loss or anorexia.  Does not endorse any shortness of breath, orthopnea or paroxysmal nocturnal dyspnea.  No lower extremity edema since admission though apparently had had some a few days prior to admission.  No chest pain, no calf tenderness.  Patient currently is unemployed without medical coverage and is being evaluated for assistance with medications etc.  Hospital course: Patient was admitted after CT scan of the chest revealed possible DVT.  Chest CT also revealed possible pneumonia on the right.  There is send left upper lobe 2.4 cm nodule.  Upper extremity ultrasound confirmed occlusive thrombus in the internal jugular vein, subclavian vein, axillary vein, and venous system in the right upper extremity.  There are also supraclavicular and cervical lymphadenopathy noted on the right which may be related to venous occlusion.  He is currently on heparin infusion.  PCCM consulted with regards to lung nodule.  Pertinent  Medical History   Patient Active Problem List   Diagnosis Date Noted   DVT (deep venous thrombosis) ([place]) 07/26/2022   Lung nodule 07/26/2022   MDD (major depressive disorder) 07/26/2022   Hypertension    DMII (diabetes mellitus, type 2) ([facility])    MDD (major depressive disorder), recurrent,  severe, with psychosis ([place]) 12/05/2018   Suicidal thoughts 12/05/2018   Cannabis use disorder, mild, abuse 12/05/2018   Cocaine use disorder, moderate, dependence ([place]) 12/05/2018   Significant Hospital Events: Including procedures, antibiotic start and stop dates in addition to other pertinent events   07/26/2022: Admitted, initiated anticoagulation for right upper extremity DVT 07/27/2022: Pulmonary consult requested  Interim History / Subjective:  States that his right arm and neck feel better.  Right arm is less swollen.  Continues to have dry cough.  Does not endorse any other symptomatology today.  Objective   Blood pressure 136/74, pulse 85, temperature 98.5 F (36.9 C), temperature source Oral, resp. rate 18, height 6\' 4"  (1.93 m), weight 125.6 kg, SpO2 98 %.        Intake/Output Summary (Last 24 hours) at 07/27/2022 1426 Last data filed at 07/27/2022 4332 Gross per 24 hour  Intake 249.09 ml  Output --  Net 249.09 ml   Filed Weights   07/26/22 1250  Weight: 125.6 kg    Examination: GENERAL: Well-developed, well-nourished gentleman, no acute distress, laying comfortably in bed.  No conversational dyspnea. HEAD: Normocephalic, atraumatic.  EYES: Pupils equal, round, reactive to light.  No scleral icterus.  MOUTH: Oral mucosa moist.  No thrush NECK: Supple. No thyromegaly. Trachea midline.  Palpable IJ cord on the right neck, palpable adenopathy. PULMONARY: Good air entry bilaterally.  No adventitious sounds. CARDIOVASCULAR: S1 and S2. Regular rate and rhythm.  No rubs, murmurs or gallops heard. ABDOMEN: Benign. MUSCULOSKELETAL: No joint deformity, no clubbing, no edema.  NEUROLOGIC: No overt  focal deficit noted. SKIN: Intact,warm,dry. PSYCH: Mood and behavior normal.  Imaging Studies  Representative image of CT scan performed 26 July 2022 showing a 2.4 cm nodule in the left upper lobe:   Assessment & Plan:  Extensive deep vein thrombosis RUE, R IJ, right  subclavian Hypercoagulability related to malignancy possible Continue anticoagulation Appears to be improving, close observation for potential thrombolysis Will need at least 6 weeks of anticoagulation before this can be held safely for procedures  Lung nodule, LUL, 2.4 cm Will need robotic assisted navigational bronchoscopy to reach This needs to be performed under general anesthesia Needs to have issues with deep vein thrombosis better controlled We will see him in clinic in 4 to 6 weeks time, this will allow him sufficient time on anticoagulation Patient will require general anesthesia for procedure would also require dedicated CT for bronchoscopy mapping will also need PET/CT prior to procedure We will arrange for necessary studies  Diabetes mellitus type 2 insulin requiring Per hospitalist team This issue adds complexity to his management  Patient social ensuring well for support Patient has been referred to Transition of Care Team for assistance Procuring assistance for the patient  Best Practice (right click and "Reselect all SmartList Selections" daily)   Diet/type: Regular consistency (see orders) DVT prophylaxis: systemic heparin GI prophylaxis: N/A Lines: N/A Foley:  N/A Code Status:  full code Last date of multidisciplinary goals of care discussion [N/A]  Labs   CBC: Recent Labs  Lab 07/26/22 1250 07/27/22 0155  WBC 10.0 9.3  NEUTROABS  --  4.7  HGB 15.1 13.6  HCT 48.0 43.4  MCV 86.5 86.6  PLT 315 294    Basic Metabolic Panel: Recent Labs  Lab 07/26/22 1250 07/27/22 0155  NA 139 139  K 3.6 4.1  CL 108 105  CO2 25 28  GLUCOSE 138* 130*  BUN 12 11  CREATININE 1.21 1.23  CALCIUM 8.9 8.6*  MG  --  1.9   GFR: Estimated Creatinine Clearance: 102.8 mL/min (by C-G formula based on SCr of 1.23 mg/dL). Recent Labs  Lab 07/26/22 1250 07/27/22 0155  WBC 10.0 9.3    Liver Function Tests: No results for input(s): "AST", "ALT", "ALKPHOS", "BILITOT",  "PROT", "ALBUMIN" in the last 168 hours. No results for input(s): "LIPASE", "AMYLASE" in the last 168 hours. No results for input(s): "AMMONIA" in the last 168 hours.  ABG No results found for: "PHART", "PCO2ART", "PO2ART", "HCO3", "TCO2", "ACIDBASEDEF", "O2SAT"   Coagulation Profile: No results for input(s): "INR", "PROTIME" in the last 168 hours.  Cardiac Enzymes: No results for input(s): "CKTOTAL", "CKMB", "CKMBINDEX", "TROPONINI" in the last 168 hours.  HbA1C: Hgb A1c MFr Bld  Date/Time Value Ref Range Status  12/05/2018 01:03 PM 12.1 (H) 4.8 - 5.6 % Final    Comment:    (NOTE) Pre diabetes:          5.7%-6.4% Diabetes:              >6.4% Glycemic control for   <7.0% adults with diabetes     CBG: Recent Labs  Lab 07/26/22 2129 07/27/22 0753 07/27/22 1155  GLUCAP 160* 115* 146*    Review of Systems:   A 10 point review of systems was performed and it is as noted above otherwise negative.  Past Medical History:  He,  has a past medical history of Hypertension.   Surgical History:   Past Surgical History:  Procedure Laterality Date   ABDOMINAL SURGERY  Social History:   reports that he has been smoking cigarettes. He has never used smokeless tobacco. He reports current alcohol use. He reports current drug use. Drug: Marijuana.   Family History:  His family history includes Diabetes in his father; Heart disease in his mother.   Allergies Allergies  Allergen Reactions   Lisinopril Cough     Home Medications  Prior to Admission medications   Medication Sig Start Date End Date Taking? Authorizing Provider  enalapril (VASOTEC) 5 MG tablet Take 1 tablet (5 mg total) by mouth daily. 12/07/19 04/05/20  Menshew, Charlesetta Ivory, PA-C  HYDROcodone-acetaminophen (NORCO/VICODIN) 5-325 MG tablet Take 1 tablet by mouth every 6 (six) hours as needed for moderate pain. Patient not taking: Reported on 07/26/2022 06/12/22 06/12/23  Tommi Rumps, PA-C  metFORMIN  (GLUCOPHAGE) 500 MG tablet Take 1 tablet (500 mg total) by mouth 2 (two) times daily with a meal. 12/07/19 04/05/20  Menshew, Charlesetta Ivory, PA-C  mirtazapine (REMERON) 15 MG tablet Take 1 tablet (15 mg total) by mouth at bedtime. Patient not taking: Reported on 07/26/2022 12/07/18   Pucilowska, Braulio Conte B, MD  PARoxetine (PAXIL) 30 MG tablet Take 1 tablet (30 mg total) by mouth daily. Patient not taking: Reported on 07/26/2022 12/08/18   Pucilowska, Ellin Goodie, MD  traZODone (DESYREL) 50 MG tablet Take 1 tablet (50 mg total) by mouth at bedtime as needed and may repeat dose one time if needed for sleep. Patient not taking: Reported on 07/26/2022 12/07/18   Pucilowska, Ellin Goodie, MD    Scheduled Meds:  insulin aspart  0-15 Units Subcutaneous TID WC   insulin aspart  0-5 Units Subcutaneous QHS   nicotine  7 mg Transdermal Daily   sodium chloride flush  3 mL Intravenous Q12H   Continuous Infusions:  heparin 1,400 Units/hr (07/27/22 1233)   PRN Meds:.acetaminophen **OR** acetaminophen   Level 4 consult    Thank you for allowing [facility] to participate in your patient's care.  We will set up appointment for the patient to see [place] after discharge.  Gailen Shelter, MD Advanced Bronchoscopy PCCM [facility]    *This note was dictated using voice recognition software/Dragon.  Despite best efforts to proofread, errors can occur which can change the meaning. Any transcriptional errors that result from this process are unintentional and may not be fully corrected at the time of dictation.
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
## 2022-07-27 NOTE — Consult Note (Signed)
ANTICOAGULATION CONSULT NOTE   Pharmacy Consult for heparin infusion Indication: DVT  Allergies  Allergen Reactions   Lisinopril Cough    Patient Measurements: Height: 6\' 4"  (193 cm) Weight: 125.6 kg (277 lb) IBW/kg (Calculated) : 86.8 Heparin Dosing Weight: 113.6kg  Vital Signs: Temp: 98.4 F (36.9 C) (10/23 0000) Temp Source: Oral (10/23 0000) BP: 137/92 (10/23 0000) Pulse Rate: 87 (10/23 0000)  Labs: Recent Labs    07/26/22 1250 07/26/22 1950 07/27/22 0155  HGB 15.1  --  13.6  HCT 48.0  --  43.4  PLT 315  --  294  APTT  --  32  --   HEPARINUNFRC  --   --  >1.10*  CREATININE 1.21  --  1.23     Estimated Creatinine Clearance: 102.8 mL/min (by C-G formula based on SCr of 1.23 mg/dL).   Medical History: Past Medical History:  Diagnosis Date   Hypertension     Medications:  PTA: N/A Inpatient: Heparin infusion (10/22 >) Allergies: No AC/APT related allergies  Assessment: 51 year old male with PMH MDD, T2DM, hypertension, lung nodule presents to ED with rich neck pain and right arm swelling. On imaging, US doppler study showing occlusive deep venous thrombosis in the right upper extremity.  Goal of Therapy:  Heparin level 0.3-0.7 units/ml Monitor platelets by anticoagulation protocol: Yes  Date Time aPTT/HL Rate/Comment 10/23 0155 HL >1.1 Supratherapeutic   Plan:  Contacted RN.  Hold infusion for 1 hour Restart heparin infusion at 1600 units/hr Recheck HL in 6 hr after restart CBC daily while on heparin   Renda Rolls, PharmD, [facility] 07/27/2022 2:49 AM

## 2022-07-28 DIAGNOSIS — I82621 Acute embolism and thrombosis of deep veins of right upper extremity: Secondary | ICD-10-CM | POA: Diagnosis not present

## 2022-07-28 LAB — CBC WITH DIFFERENTIAL/PLATELET
Abs Immature Granulocytes: 0.03 10*3/uL (ref 0.00–0.07)
Basophils Absolute: 0.1 10*3/uL (ref 0.0–0.1)
Basophils Relative: 1 %
Eosinophils Absolute: 0.4 10*3/uL (ref 0.0–0.5)
Eosinophils Relative: 3 %
HCT: 43.2 % (ref 39.0–52.0)
Hemoglobin: 13.2 g/dL (ref 13.0–17.0)
Immature Granulocytes: 0 %
Lymphocytes Relative: 26 %
Lymphs Abs: 3 10*3/uL (ref 0.7–4.0)
MCH: 26.5 pg (ref 26.0–34.0)
MCHC: 30.6 g/dL (ref 30.0–36.0)
MCV: 86.7 fL (ref 80.0–100.0)
Monocytes Absolute: 2.1 10*3/uL — ABNORMAL HIGH (ref 0.1–1.0)
Monocytes Relative: 18 %
Neutro Abs: 5.9 10*3/uL (ref 1.7–7.7)
Neutrophils Relative %: 52 %
Platelets: 343 10*3/uL (ref 150–400)
RBC: 4.98 MIL/uL (ref 4.22–5.81)
RDW: 14.3 % (ref 11.5–15.5)
Smear Review: NORMAL
WBC: 11.5 10*3/uL — ABNORMAL HIGH (ref 4.0–10.5)
nRBC: 0 % (ref 0.0–0.2)

## 2022-07-28 LAB — BASIC METABOLIC PANEL
Anion gap: 4 — ABNORMAL LOW (ref 5–15)
BUN: 11 mg/dL (ref 6–20)
CO2: 28 mmol/L (ref 22–32)
Calcium: 8.6 mg/dL — ABNORMAL LOW (ref 8.9–10.3)
Chloride: 107 mmol/L (ref 98–111)
Creatinine, Ser: 1.24 mg/dL (ref 0.61–1.24)
GFR, Estimated: >60 mL/min (ref >60)
Glucose, Bld: 123 mg/dL — ABNORMAL HIGH (ref 70–99)
Potassium: 4.3 mmol/L (ref 3.5–5.1)
Sodium: 139 mmol/L (ref 135–145)

## 2022-07-28 LAB — MAGNESIUM: Magnesium: 2 mg/dL (ref 1.7–2.4)

## 2022-07-28 LAB — GLUCOSE, CAPILLARY
Glucose-Capillary: 137 mg/dL — ABNORMAL HIGH (ref 70–99)
Glucose-Capillary: 123 mg/dL — ABNORMAL HIGH (ref 70–99)
Glucose-Capillary: 127 mg/dL — ABNORMAL HIGH (ref 70–99)
Glucose-Capillary: 133 mg/dL — ABNORMAL HIGH (ref 70–99)

## 2022-07-28 LAB — HEPARIN LEVEL (UNFRACTIONATED): Heparin Unfractionated: 0.51 IU/mL (ref 0.30–0.70)

## 2022-07-28 MED ORDER — SODIUM CHLORIDE 0.9 % IV SOLN: INTRAVENOUS | Status: DC

## 2022-07-28 MED ORDER — FAMOTIDINE 20 MG PO TABS: 40.0000 mg | ORAL_TABLET | Freq: Once | ORAL | Status: DC | PRN

## 2022-07-28 MED ORDER — METHYLPREDNISOLONE SODIUM SUCC 125 MG IJ SOLR: 125.0000 mg | Freq: Once | INTRAMUSCULAR | Status: DC | PRN

## 2022-07-28 MED ORDER — DIPHENHYDRAMINE HCL 50 MG/ML IJ SOLN: 50.0000 mg | Freq: Once | INTRAMUSCULAR | Status: DC | PRN

## 2022-07-28 MED ORDER — HYDROCODONE-ACETAMINOPHEN 5-325 MG PO TABS
1.0000 | ORAL_TABLET | Freq: Four times a day (QID) | ORAL | Status: DC | PRN
  Administered 2022-07-28 – 2022-07-29 (×3): 2 via ORAL
  Administered 2022-07-30 (×2): 1 via ORAL
  Administered 2022-07-31: 2 via ORAL
  Filled 2022-07-28 (×3): qty 2
  Filled 2022-07-28 (×2): qty 1
  Filled 2022-07-28: qty 2

## 2022-07-28 MED ORDER — CEFAZOLIN SODIUM-DEXTROSE 2-4 GM/100ML-% IV SOLN: 2.0000 g | INTRAVENOUS | Status: DC

## 2022-07-28 MED ORDER — MIDAZOLAM HCL 2 MG/ML PO SYRP: 8.0000 mg | ORAL_SOLUTION | Freq: Once | ORAL | Status: DC | PRN

## 2022-07-28 NOTE — TOC Benefit Eligibility Note (Signed)
Transition of Care [facility]) Benefit Eligibility Note    Patient Details  Name: Cameron Prince MRN: [id number] Date of Birth: 1971-05-24   Medication/Dose: Eliquis 37m BID for 7 days then 579mBID  Covered?: Yes  Prescription Coverage Preferred Pharmacy: [facility]  Spoke with Person/Company/Phone Number:: [employer] [phone]-Pay: $15 estimated cost for 30 day supply retail, $45 estimated cost for 45 day supply retail or mail.  Prior Approval: Yes (Prior approval required.  Number to call: [phone] Limited to 74 pills within 135 day period.  If more required, will need to ask for a quanity review.  If needed, can do at the same time of PA per rep.)  Deductible:  (No deductible.  Out of pocket: $1,700, $0 met as of time of call.)   HoDannette Barbarahone Number: [phone]/24/2023, 12:08 PM

## 2022-07-28 NOTE — Progress Notes (Signed)
Dr. Tyrell Antonio aware that patient is refusing insulin.

## 2022-07-28 NOTE — Progress Notes (Signed)
PROGRESS NOTE    Cameron Prince  BTD:[id number] DOB: 1971-08-13 DOA: 07/26/2022 PCP: Patient, No Pcp Per   Brief Narrative: 51 year old with past medical history significant for hypertension, diabetes type 2, ongoing tobacco use presented with 2 days history of swelling right lateral neck and axilla area, extending to the right hand.  He has been recently incarcerated, he has not been on any medication and does not have insurance or PCP. Evaluation in the ED he was found to have significant clot extending from the right IJ and involving the subclavian, axillary cephalic, basilic brachial radial ulnar veins.  Case was discussed with vascular surgery who recommend heparin drip, any worsening symptoms we could consider catheter directed thrombolysis at that time.  Patient was also found to have 2.4 cm.  Pleural-based nodule in the left upper lobe concerning for underlying malignancy.    Assessment & Plan:   Principal Problem:   DVT (deep venous thrombosis) (HCC) Active Problems:   Lung nodule   MDD (major depressive disorder)   Hypertension   DMII (diabetes mellitus, type 2) (HCC)   1-DVT right upper extremity from right IJ involving subclavian, axillary, cephalic, basilic, brachial, radial, ulnar veins: -Continue with heparin drip. -Doppler Lower extremity: Negative for DVT>  -Vascular consulted, and planning Thrombectomy on Thursday.  -CM consulted to assist with Eliquis.   2-Lung nodule: History of tobacco use, now presenting with DVT. CT chest showed 2.4 cm left upper lobe lung nodule, right supraclavicular enlarged lymph nodes measuring up to 1.5 cm. -Pulmonology consulted. They will help arrange follow up out patient.  Patient will need navigational bronchoscopy.  He will need to receive at least 6 weeks of anticoagulation before procedure.  He has appointment schedule on  09/08/2022 at 2:00.    3-Diabetes type 2: SSI   4-Hypertension: Monitor BP start medications as  needed.   5-MDD: Not on medications      Estimated body mass index is 33.72 kg/m as calculated from the following:   Height as of this encounter: 6\' 4"  (1.93 m).   Weight as of this encounter: 125.6 kg.   DVT prophylaxis: Heparin gtt Code Status: Full code Family Communication: care discussed with patient.  Disposition Plan:  Status is: Inpatient Remains inpatient appropriate because: DVT    Consultants:  Pulmonologist   Procedures:    Antimicrobials:    Subjective: Pain is not controlled with tylenol. Vicodin has been started.  Denies worsening swelling.   Objective: Vitals:   07/28/22 0404 07/28/22 0735 07/28/22 1146 07/28/22 1608  BP: 117/81 135/83 (!) 145/101 133/86  Pulse: 72 79 89 83  Resp: 16 18 18 18   Temp: 98.2 F (36.8 C) 99.4 F (37.4 C) 99.3 F (37.4 C) 98.5 F (36.9 C)  TempSrc:  Oral Oral Oral  SpO2: 97% 98% 98% 98%  Weight:      Height:        Intake/Output Summary (Last 24 hours) at 07/28/2022 1651 Last data filed at 07/28/2022 1000 Gross per 24 hour  Intake 394.02 ml  Output --  Net 394.02 ml    Filed Weights   07/26/22 1250  Weight: 125.6 kg    Examination:  General exam: NAD Respiratory system: CCTA Cardiovascular system: S 1, S 2 RRR Gastrointestinal system: BS present, soft,,nt Extremities: Right arm with swelling  Data Reviewed: I have personally reviewed following labs and imaging studies  CBC: Recent Labs  Lab 07/26/22 1250 07/27/22 0155 07/28/22 0044  WBC 10.0 9.3 11.5*  NEUTROABS  --  4.7 5.9  HGB 15.1 13.6 13.2  HCT 48.0 43.4 43.2  MCV 86.5 86.6 86.7  PLT 315 294 343    Basic Metabolic Panel: Recent Labs  Lab 07/26/22 1250 07/27/22 0155 07/28/22 0044  NA 139 139 139  K 3.6 4.1 4.3  CL 108 105 107  CO2 25 28 28   GLUCOSE 138* 130* 123*  BUN 12 11 11   CREATININE 1.21 1.23 1.24  CALCIUM 8.9 8.6* 8.6*  MG  --  1.9 2.0    GFR: Estimated Creatinine Clearance: 102 mL/min (by C-G formula  based on SCr of 1.24 mg/dL). Liver Function Tests: No results for input(s): "AST", "ALT", "ALKPHOS", "BILITOT", "PROT", "ALBUMIN" in the last 168 hours. No results for input(s): "LIPASE", "AMYLASE" in the last 168 hours. No results for input(s): "AMMONIA" in the last 168 hours. Coagulation Profile: No results for input(s): "INR", "PROTIME" in the last 168 hours. Cardiac Enzymes: No results for input(s): "CKTOTAL", "CKMB", "CKMBINDEX", "TROPONINI" in the last 168 hours. BNP (last 3 results) No results for input(s): "PROBNP" in the last 8760 hours. HbA1C: Recent Labs    07/26/22 1303  HGBA1C 5.5   CBG: Recent Labs  Lab 07/27/22 1743 07/27/22 2133 07/28/22 0737 07/28/22 1155 07/28/22 1605  GLUCAP 84 104* 137* 123* 127*    Lipid Profile: No results for input(s): "CHOL", "HDL", "LDLCALC", "TRIG", "CHOLHDL", "LDLDIRECT" in the last 72 hours. Thyroid Function Tests: No results for input(s): "TSH", "T4TOTAL", "FREET4", "T3FREE", "THYROIDAB" in the last 72 hours. Anemia Panel: No results for input(s): "VITAMINB12", "FOLATE", "FERRITIN", "TIBC", "IRON", "RETICCTPCT" in the last 72 hours. Sepsis Labs: No results for input(s): "PROCALCITON", "LATICACIDVEN" in the last 168 hours.  Recent Results (from the past 240 hour(s))  Resp Panel by RT-PCR (Flu A&B, Covid) Anterior Nasal Swab     Status: None   Collection Time: 07/26/22  1:28 PM   Specimen: Anterior Nasal Swab  Result Value Ref Range Status   SARS Coronavirus 2 by RT PCR NEGATIVE NEGATIVE Final    Comment: (NOTE) SARS-CoV-2 target nucleic acids are NOT DETECTED.  The SARS-CoV-2 RNA is generally detectable in upper respiratory specimens during the acute phase of infection. The lowest concentration of SARS-CoV-2 viral copies this assay can detect is 138 copies/mL. A negative result does not preclude SARS-Cov-2 infection and should not be used as the sole basis for treatment or other patient management decisions. A negative  result may occur with  improper specimen collection/handling, submission of specimen other than nasopharyngeal swab, presence of viral mutation(s) within the areas targeted by this assay, and inadequate number of viral copies(<138 copies/mL). A negative result must be combined with clinical observations, patient history, and epidemiological information. The expected result is Negative.  Fact Sheet for Patients:  07/30/22  Fact Sheet for Healthcare Providers:  07/28/22  This test is no t yet approved or cleared by the [employer] FDA and  has been authorized for detection and/or diagnosis of SARS-CoV-2 by FDA under an Emergency Use Authorization (EUA). This EUA will remain  in effect (meaning this test can be used) for the duration of the COVID-19 declaration under Section 564(b)(1) of the Act, 21 U.S.C.section 360bbb-3(b)(1), unless the authorization is terminated  or revoked sooner.       Influenza A by PCR NEGATIVE NEGATIVE Final   Influenza B by PCR NEGATIVE NEGATIVE Final    Comment: (NOTE) The Xpert Xpress SARS-CoV-2/FLU/RSV plus assay is intended as an aid in the diagnosis of influenza from Nasopharyngeal swab specimens and  should not be used as a sole basis for treatment. Nasal washings and aspirates are unacceptable for Xpert Xpress SARS-CoV-2/FLU/RSV testing.  Fact Sheet for Patients: EntrepreneurPulse.com.au  Fact Sheet for Healthcare Providers: IncredibleEmployment.be  This test is not yet approved or cleared by the [place] FDA and has been authorized for detection and/or diagnosis of SARS-CoV-2 by FDA under an Emergency Use Authorization (EUA). This EUA will remain in effect (meaning this test can be used) for the duration of the COVID-19 declaration under Section 564(b)(1) of the Act, 21 U.S.C. section 360bbb-3(b)(1), unless the authorization is  terminated or revoked.  Performed at [facility], [address] [place], [place] [address]   Group A Strep by PCR     Status: None   Collection Time: 07/26/22  1:28 PM   Specimen: Anterior Nasal Swab; Sterile Swab  Result Value Ref Range Status   Group A Strep by PCR NOT DETECTED NOT DETECTED Final    Comment: Performed at [facility], [address] [place], [place] [address]         Radiology Studies: US Venous Img Lower Bilateral (DVT)  Result Date: 07/26/2022 CLINICAL DATA:  Documented right upper extremity DVT on ultrasound earlier today. Check for lower extremity DVTs. EXAM: BILATERAL LOWER EXTREMITY VENOUS DOPPLER ULTRASOUND TECHNIQUE: Gray-scale sonography with graded compression, as well as color Doppler and duplex ultrasound were performed to evaluate the lower extremity deep venous systems from the level of the common femoral vein and including the common femoral, femoral, profunda femoral, popliteal and calf veins including the posterior tibial, peroneal and gastrocnemius veins when visible. The superficial great saphenous vein was also interrogated. Spectral Doppler was utilized to evaluate flow at rest and with distal augmentation maneuvers in the common femoral, femoral and popliteal veins. COMPARISON:  None Available. FINDINGS: RIGHT LOWER EXTREMITY Common Femoral Vein: No evidence of thrombus. Normal compressibility, respiratory phasicity and response to augmentation. Saphenofemoral Junction: No evidence of thrombus. Normal compressibility and flow on color Doppler imaging. Profunda Femoral Vein: No evidence of thrombus. Normal compressibility and flow on color Doppler imaging. Femoral Vein: No evidence of thrombus. Normal compressibility, respiratory phasicity and response to augmentation. Popliteal Vein: No evidence of thrombus. Normal compressibility, respiratory phasicity and response to augmentation. Calf Veins: No evidence of thrombus. Normal  compressibility and flow on color Doppler imaging. Superficial Great Saphenous Vein: No evidence of thrombus. Normal compressibility. Venous Reflux:  None. Other Findings:  None. LEFT LOWER EXTREMITY Common Femoral Vein: No evidence of thrombus. Normal compressibility, respiratory phasicity and response to augmentation. Saphenofemoral Junction: No evidence of thrombus. Normal compressibility and flow on color Doppler imaging. Profunda Femoral Vein: No evidence of thrombus. Normal compressibility and flow on color Doppler imaging. Femoral Vein: No evidence of thrombus. Normal compressibility, respiratory phasicity and response to augmentation. Popliteal Vein: No evidence of thrombus. Normal compressibility, respiratory phasicity and response to augmentation. Calf Veins: No evidence of thrombus. Normal compressibility and flow on color Doppler imaging. Superficial Great Saphenous Vein: No evidence of thrombus. Normal compressibility. Venous Reflux:  None. Other Findings:  None. IMPRESSION: No evidence of deep venous thrombosis in either lower extremity. Electronically Signed   By: Telford Nab M.D.   On: 07/26/2022 20:32   US Venous Img Upper Right (DVT Study)  Result Date: 07/26/2022 CLINICAL DATA:  Possible thrombus seen on CT. EXAM: Right UPPER EXTREMITY VENOUS DOPPLER ULTRASOUND TECHNIQUE: Gray-scale sonography with graded compression, as well as color Doppler and duplex ultrasound were performed to evaluate the upper  extremity deep venous system from the level of the subclavian vein and including the jugular, axillary, basilic, radial, ulnar and upper cephalic vein. Spectral Doppler was utilized to evaluate flow at rest and with distal augmentation maneuvers. COMPARISON:  CT scan of same day. FINDINGS: Contralateral Subclavian Vein:  Not visualized. Internal Jugular Vein: Noncompressible consistent with occlusive thrombus. Subclavian Vein: Noncompressible consistent with occlusive thrombus. Axillary Vein:  Noncompressible consistent with occlusive thrombus. Cephalic Vein: Noncompressible with partial flow. Basilic Vein: Noncompressible consistent with occlusive thrombus. Brachial Veins: Noncompressible consistent with occlusive thrombus. Radial Veins: Noncompressible consistent with occlusive thrombus. Ulnar Veins: Noncompressible consistent with occlusive thrombus. Venous Reflux:  None visualized. Other Findings: Enlarged right cervical lymph node is noted as described on prior CT scan. IMPRESSION: Findings consistent with predominantly occlusive deep venous thrombosis in the right upper extremity. Electronically Signed   By: Lupita Raider M.D.   On: 07/26/2022 18:09        Scheduled Meds:  amoxicillin-clavulanate  1 tablet Oral Q12H   insulin aspart  0-15 Units Subcutaneous TID WC   insulin aspart  0-5 Units Subcutaneous QHS   nicotine  7 mg Transdermal Daily   sodium chloride flush  3 mL Intravenous Q12H   Continuous Infusions:  heparin 1,400 Units/hr (07/28/22 1453)     LOS: 2 days    Time spent: 35 minutes.     Alba Cory, MD [facility]   If 7PM-7AM, please contact night-coverage [email or web address]  07/28/2022, 4:51 PM

## 2022-07-28 NOTE — TOC Initial Note (Signed)
Transition of Care [facility]) - Initial/Assessment Note    Patient Details  Name: Cameron Prince MRN: [id number] Date of Birth: [birth year]  Transition of Care [facility]) CM/SW Contact:    Alberteen Sam, LCSW Phone Number: 07/28/2022, 11:43 AM  Clinical Narrative:                  CSW met with patient at bedside, he confirms no PCP. He does have [place], [place] encouraged him to go onto [employer] to identify providers in network in which he can choose his PCP.   Patient reports he goes to [facility] off [address].   Patient will be on eliquis at dc, CSW has submitted for benefits check with insurance to determine costs.   NO other dc needs identified at this time.    Expected Discharge Plan: Home/Self Care Barriers to Discharge: Continued Medical Work up   Patient Goals and CMS Choice Patient states their goals for this hospitalization and ongoing recovery are:: to go home CMS Medicare.gov Compare Post Acute Care list provided to:: Patient Choice offered to / list presented to : Patient  Expected Discharge Plan and Services Expected Discharge Plan: Home/Self Care                                              Prior Living Arrangements/Services                       Activities of Daily Living      Permission Sought/Granted                  Emotional Assessment              Admission diagnosis:  Lymphadenopathy [R59.1] DVT (deep venous thrombosis) (HCC) [I82.409] Acute deep vein thrombosis (DVT) of right upper extremity, unspecified vein ([place]) [I82.621] Patient Active Problem List   Diagnosis Date Noted   DVT (deep venous thrombosis) ([place]) 07/26/2022   Lung nodule 07/26/2022   MDD (major depressive disorder) 07/26/2022   Hypertension    DMII (diabetes mellitus, type 2) ([place])    MDD (major depressive disorder), recurrent, severe, with psychosis ([place]) 12/05/2018   Suicidal thoughts 12/05/2018   Cannabis use disorder, mild, abuse  12/05/2018   Cocaine use disorder, moderate, dependence ([place]) 12/05/2018   PCP:  Patient, No Pcp Per Pharmacy:   [facility] [address]), [place]) [place] [address] Phone: [phone] Fax: [phone]     Social Determinants of Health (SDOH) Interventions    Readmission Risk Interventions     No data to display

## 2022-07-28 NOTE — Consult Note (Signed)
ANTICOAGULATION CONSULT NOTE   Pharmacy Consult for heparin infusion Indication: DVT  Allergies  Allergen Reactions   Lisinopril Cough    Patient Measurements: Height: 6\' 4"  (193 cm) Weight: 125.6 kg (277 lb) IBW/kg (Calculated) : 86.8 Heparin Dosing Weight: 113.6kg  Vital Signs: Temp: 97.9 F (36.6 C) (10/23 2344) Temp Source: Oral (10/23 1744) BP: 150/92 (10/23 2344) Pulse Rate: 83 (10/23 2344)  Labs: Recent Labs    07/26/22 1250 07/26/22 1250 07/26/22 1950 07/27/22 0155 07/27/22 1139 07/27/22 1841 07/28/22 0044  HGB 15.1  --   --  13.6  --   --  13.2  HCT 48.0  --   --  43.4  --   --  43.2  PLT 315  --   --  294  --   --  343  APTT  --   --  32  --   --   --   --   HEPARINUNFRC  --    < >  --  >1.10* 0.85* 0.65 0.51  CREATININE 1.21  --   --  1.23  --   --  1.24   < > = values in this interval not displayed.     Estimated Creatinine Clearance: 102 mL/min (by C-G formula based on SCr of 1.24 mg/dL).   Medical History: Past Medical History:  Diagnosis Date   Hypertension     Medications:  PTA: N/A Inpatient: Heparin infusion (10/22 >) Allergies: No AC/APT related allergies  Assessment: 51 year old male with PMH MDD, T2DM, hypertension, lung nodule presents to ED with rich neck pain and right arm swelling. On imaging, US doppler study showing occlusive deep venous thrombosis in the right upper extremity.  Goal of Therapy:  Heparin level 0.3-0.7 units/ml Monitor platelets by anticoagulation protocol: Yes  Date Time aPTT/HL Rate/Comment 10/23 0155 HL >1.1 Supratherapeutic  10/23 1139 HL 0.85 Supratherapeutic 10/23 1841 HL 0.65 Therapeutic x 1 @ 1400 units/hr 10/23 0044 HL 0.51 Therapeutic x 2 @ 1400 units/hr  Plan:  Heparin therapeutic Continue heparin infusion at 1400 units/hr Recheck HL with AM labs on 10/25 CBC daily while on heparin   Pearla Dubonnet, PharmD Clinical Pharmacist 07/28/2022 1:36 AM

## 2022-07-28 NOTE — H&P (View-Only) (Signed)
Work-up for malignancy has been completed.  We will tentatively plan on having the patient undergo right upper extremity thrombectomy on Wednesday versus Thursday.

## 2022-07-28 NOTE — Progress Notes (Signed)
Work-up for malignancy has been completed.  We will tentatively plan on having the patient undergo right upper extremity thrombectomy on Wednesday versus Thursday. 

## 2022-07-29 ENCOUNTER — Encounter
Admission: EM | Disposition: A | Discharge status: Home / Self Care | Payer: Self-pay | Source: Home / Self Care | Attending: Family Medicine

## 2022-07-29 DIAGNOSIS — I82A11 Acute embolism and thrombosis of right axillary vein: Secondary | ICD-10-CM

## 2022-07-29 DIAGNOSIS — I82621 Acute embolism and thrombosis of deep veins of right upper extremity: Secondary | ICD-10-CM | POA: Diagnosis not present

## 2022-07-29 DIAGNOSIS — I82B11 Acute embolism and thrombosis of right subclavian vein: Secondary | ICD-10-CM

## 2022-07-29 DIAGNOSIS — I871 Compression of vein: Secondary | ICD-10-CM

## 2022-07-29 HISTORY — PX: PERIPHERAL VASCULAR THROMBECTOMY: CATH118306

## 2022-07-29 LAB — CBC WITH DIFFERENTIAL/PLATELET
Abs Immature Granulocytes: 0.04 10*3/uL (ref 0.00–0.07)
Basophils Absolute: 0.1 10*3/uL (ref 0.0–0.1)
Basophils Relative: 1 %
Eosinophils Absolute: 0.4 10*3/uL (ref 0.0–0.5)
Eosinophils Relative: 4 %
HCT: 40.9 % (ref 39.0–52.0)
Hemoglobin: 12.7 g/dL — ABNORMAL LOW (ref 13.0–17.0)
Immature Granulocytes: 0 %
Lymphocytes Relative: 25 %
Lymphs Abs: 2.7 10*3/uL (ref 0.7–4.0)
MCH: 26.8 pg (ref 26.0–34.0)
MCHC: 31.1 g/dL (ref 30.0–36.0)
MCV: 86.5 fL (ref 80.0–100.0)
Monocytes Absolute: 2 10*3/uL — ABNORMAL HIGH (ref 0.1–1.0)
Monocytes Relative: 18 %
Neutro Abs: 5.8 10*3/uL (ref 1.7–7.7)
Neutrophils Relative %: 52 %
Platelets: 372 10*3/uL (ref 150–400)
RBC: 4.73 MIL/uL (ref 4.22–5.81)
RDW: 14.3 % (ref 11.5–15.5)
WBC: 11.2 10*3/uL — ABNORMAL HIGH (ref 4.0–10.5)
nRBC: 0 % (ref 0.0–0.2)

## 2022-07-29 LAB — GLUCOSE, CAPILLARY
Glucose-Capillary: 81 mg/dL (ref 70–99)
Glucose-Capillary: 100 mg/dL — ABNORMAL HIGH (ref 70–99)
Glucose-Capillary: 96 mg/dL (ref 70–99)
Glucose-Capillary: 76 mg/dL (ref 70–99)
Glucose-Capillary: 155 mg/dL — ABNORMAL HIGH (ref 70–99)

## 2022-07-29 LAB — BASIC METABOLIC PANEL
Anion gap: 5 (ref 5–15)
BUN: 9 mg/dL (ref 6–20)
CO2: 25 mmol/L (ref 22–32)
Calcium: 8.4 mg/dL — ABNORMAL LOW (ref 8.9–10.3)
Chloride: 107 mmol/L (ref 98–111)
Creatinine, Ser: 0.86 mg/dL (ref 0.61–1.24)
GFR, Estimated: >60 mL/min (ref >60)
Glucose, Bld: 114 mg/dL — ABNORMAL HIGH (ref 70–99)
Potassium: 3.9 mmol/L (ref 3.5–5.1)
Sodium: 137 mmol/L (ref 135–145)

## 2022-07-29 LAB — MAGNESIUM: Magnesium: 1.9 mg/dL (ref 1.7–2.4)

## 2022-07-29 LAB — HEPARIN LEVEL (UNFRACTIONATED): Heparin Unfractionated: 0.38 IU/mL (ref 0.30–0.70)

## 2022-07-29 SURGERY — PERIPHERAL VASCULAR THROMBECTOMY
Anesthesia: Moderate Sedation | Laterality: Right

## 2022-07-29 MED ORDER — CEFAZOLIN SODIUM-DEXTROSE 2-4 GM/100ML-% IV SOLN
INTRAVENOUS | Status: AC
  Filled 2022-07-29: qty 100

## 2022-07-29 MED ORDER — ONDANSETRON HCL 4 MG/2ML IJ SOLN: 4.0000 mg | Freq: Four times a day (QID) | INTRAMUSCULAR | Status: DC | PRN

## 2022-07-29 MED ORDER — HYDRALAZINE HCL 20 MG/ML IJ SOLN
INTRAMUSCULAR | Status: AC
  Filled 2022-07-29: qty 1

## 2022-07-29 MED ORDER — FENTANYL CITRATE PF 50 MCG/ML IJ SOSY
PREFILLED_SYRINGE | INTRAMUSCULAR | Status: AC
  Filled 2022-07-29: qty 1

## 2022-07-29 MED ORDER — HEPARIN (PORCINE) 25000 UT/250ML-% IV SOLN
1700.0000 [IU]/h | INTRAVENOUS | Status: DC
  Administered 2022-07-30 (×2): 1700 [IU]/h via INTRAVENOUS
  Filled 2022-07-29 (×2): qty 250

## 2022-07-29 MED ORDER — MIDAZOLAM HCL 2 MG/2ML IJ SOLN
INTRAMUSCULAR | Status: DC | PRN
  Administered 2022-07-29 (×2): .5 mg via INTRAVENOUS
  Administered 2022-07-29: 2 mg via INTRAVENOUS
  Administered 2022-07-29 (×2): 1 mg via INTRAVENOUS

## 2022-07-29 MED ORDER — MIDAZOLAM HCL 5 MG/5ML IJ SOLN
INTRAMUSCULAR | Status: AC
  Filled 2022-07-29: qty 5

## 2022-07-29 MED ORDER — FENTANYL CITRATE (PF) 100 MCG/2ML IJ SOLN
INTRAMUSCULAR | Status: AC
  Filled 2022-07-29: qty 2

## 2022-07-29 MED ORDER — ALTEPLASE 2 MG IJ SOLR
INTRAMUSCULAR | Status: AC
  Filled 2022-07-29: qty 10

## 2022-07-29 MED ORDER — ALTEPLASE 1 MG/ML SYRINGE FOR VASCULAR PROCEDURE
INTRAMUSCULAR | Status: DC | PRN
  Administered 2022-07-29: 10 mg

## 2022-07-29 MED ORDER — HEPARIN (PORCINE) 25000 UT/250ML-% IV SOLN: 1400.0000 [IU]/h | INTRAVENOUS | Status: DC

## 2022-07-29 MED ORDER — HYDRALAZINE HCL 25 MG PO TABS
25.0000 mg | ORAL_TABLET | Freq: Once | ORAL | Status: AC
  Administered 2022-07-29: 25 mg via ORAL
  Filled 2022-07-29: qty 1

## 2022-07-29 MED ORDER — HEPARIN SODIUM (PORCINE) 1000 UNIT/ML IJ SOLN
INTRAMUSCULAR | Status: AC
  Filled 2022-07-29: qty 10

## 2022-07-29 MED ORDER — CEFAZOLIN SODIUM-DEXTROSE 1-4 GM/50ML-% IV SOLN
INTRAVENOUS | Status: DC | PRN
  Administered 2022-07-29: 2 g via INTRAVENOUS

## 2022-07-29 MED ORDER — FENTANYL CITRATE (PF) 100 MCG/2ML IJ SOLN
INTRAMUSCULAR | Status: DC | PRN
  Administered 2022-07-29 (×2): 25 ug via INTRAVENOUS
  Administered 2022-07-29: 50 ug via INTRAVENOUS
  Administered 2022-07-29 (×2): 25 ug via INTRAVENOUS

## 2022-07-29 MED ORDER — HYDROMORPHONE HCL 1 MG/ML IJ SOLN: 1.0000 mg | Freq: Once | INTRAMUSCULAR | Status: DC | PRN

## 2022-07-29 MED ORDER — IODIXANOL 320 MG/ML IV SOLN
INTRAVENOUS | Status: DC | PRN
  Administered 2022-07-29: 75 mL

## 2022-07-29 MED ORDER — HEPARIN SODIUM (PORCINE) 1000 UNIT/ML IJ SOLN
INTRAMUSCULAR | Status: DC | PRN
  Administered 2022-07-29: 4000 [IU] via INTRAVENOUS

## 2022-07-29 SURGICAL SUPPLY — 23 items
BALLN ATG 12X6X80 (BALLOONS) ×1
BALLN DORADO 8X100X80 (BALLOONS) ×1
BALLOON ATG 12X6X80 (BALLOONS) IMPLANT
BALLOON DORADO 8X100X80 (BALLOONS) IMPLANT
CANISTER PENUMBRA ENGINE (MISCELLANEOUS) IMPLANT
CATH BERNSTEIN 5FR 130CM (CATHETERS) IMPLANT
CATH INDIGO 12XTORQ 100 (CATHETERS) IMPLANT
CATH INFUS 90CMX20CM (CATHETERS) IMPLANT
CATH KUMPE SOFT-VU 5FR 65 (CATHETERS) IMPLANT
CLOSURE PERCLOSE PROSTYLE (VASCULAR PRODUCTS) IMPLANT
COVER PROBE ULTRASOUND 5X96 (MISCELLANEOUS) IMPLANT
DEVICE TORQUE .025-.038 (MISCELLANEOUS) IMPLANT
DRAPE BRACHIAL (DRAPES) IMPLANT
GLIDEWIRE STIFF .35X180X3 HYDR (WIRE) IMPLANT
KIT ENCORE 26 ADVANTAGE (KITS) IMPLANT
NDL ENTRY 21GA 7CM ECHOTIP (NEEDLE) IMPLANT
NEEDLE ENTRY 21GA 7CM ECHOTIP (NEEDLE) ×1 IMPLANT
PACK ANGIOGRAPHY (CUSTOM PROCEDURE TRAY) ×1 IMPLANT
SET INTRO CAPELLA COAXIAL (SET/KITS/TRAYS/PACK) IMPLANT
SHEATH BRITE TIP 7FRX5.5 (SHEATH) IMPLANT
SHEATH PINNACLE 11FRX10 (SHEATH) IMPLANT
SUT MNCRL AB 4-0 PS2 18 (SUTURE) IMPLANT
WIRE GUIDERIGHT .035X150 (WIRE) IMPLANT

## 2022-07-29 NOTE — Consult Note (Signed)
ANTICOAGULATION CONSULT NOTE   Pharmacy Consult for heparin infusion Indication: DVT  Allergies  Allergen Reactions   Lisinopril Cough    Patient Measurements: Height: 6\' 4"  (193 cm) Weight: 125.6 kg (277 lb) IBW/kg (Calculated) : 86.8 Heparin Dosing Weight: 113.6kg  Vital Signs: Temp: 98.6 F (37 C) (10/25 0401) Temp Source: Oral (10/24 1929) BP: 122/77 (10/25 0401) Pulse Rate: 79 (10/25 0401)  Labs: Recent Labs    07/26/22 1250 07/26/22 1950 07/27/22 0155 07/27/22 1139 07/27/22 1841 07/28/22 0044 07/29/22 0504  HGB 15.1  --  13.6  --   --  13.2 12.7*  HCT 48.0  --  43.4  --   --  43.2 40.9  PLT 315  --  294  --   --  343 372  APTT  --  32  --   --   --   --   --   HEPARINUNFRC  --   --  >1.10*   < > 0.65 0.51 0.38  CREATININE 1.21  --  1.23  --   --  1.24  --    < > = values in this interval not displayed.     Estimated Creatinine Clearance: 102 mL/min (by C-G formula based on SCr of 1.24 mg/dL).   Medical History: Past Medical History:  Diagnosis Date   Hypertension     Medications:  PTA: N/A Inpatient: Heparin infusion (10/22 >) Allergies: No AC/APT related allergies  Assessment: 51 year old male with PMH MDD, T2DM, hypertension, lung nodule presents to ED with rich neck pain and right arm swelling. On imaging, US doppler study showing occlusive deep venous thrombosis in the right upper extremity.  Goal of Therapy:  Heparin level 0.3-0.7 units/ml Monitor platelets by anticoagulation protocol: Yes  Date Time aPTT/HL Rate/Comment 10/23 0155 HL >1.1 Supratherapeutic  10/23 1139 HL 0.85 Supratherapeutic 10/23 1841 HL 0.65 Therapeutic x 1 @ 1400 units/hr 10/24 0044 HL 0.51 Therapeutic x 2 @ 1400 units/hr 10/25 0504 HL 0.38 Therapeutic x 3 @ 1400 units/hr  Plan:  Heparin therapeutic Continue heparin infusion at 1400 units/hr Recheck HL with AM labs on 10/26 CBC daily while on heparin   Pearla Dubonnet, PharmD Clinical  Pharmacist 07/29/2022 5:38 AM

## 2022-07-29 NOTE — Interval H&P Note (Signed)
History and Physical Interval Note:  07/29/2022 3:15 PM  Cameron Prince  has presented today for surgery, with the diagnosis of Right upper extremity DVT.  The various methods of treatment have been discussed with the patient and family. After consideration of risks, benefits and other options for treatment, the patient has consented to  Procedure(s): PERIPHERAL VASCULAR THROMBECTOMY (Right) as a surgical intervention.  The patient's history has been reviewed, patient examined, no change in status, stable for surgery.  I have reviewed the patient's chart and labs.  Questions were answered to the patient's satisfaction.     Hortencia Pilar

## 2022-07-29 NOTE — Op Note (Signed)
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   US guidance for vascular access to right brachial vein just below the level of the antecubital fossa  2.   Catheter placement into right atrium  3.   Superior venacavogram and right arm venogram 4.   Catheter directed thrombolysis with 10 mg of tPA to the right proximal brachial vein, axillary vein, subclavian vein and right innominate vein 5.   Mechanical thrombectomy axillary vein subclavian vein and innominate vein with the penumbra CAT 12 6.   PTA of innominate vein and subclavian vein with 12 mm balloon    SURGEON: Hortencia Pilar, MD  ASSISTANT(S): none  ANESTHESIA: Continuous ECG pulse oximetry and cardiopulmonary monitoring was performed throughout the entire procedure by the interventional radiology nurse total sedation time was 1 hour and 12 minutes.  Parenteral fentanyl and Versed was utilized.  ESTIMATED BLOOD LOSS: 200 cc  FINDING(S): 1.  Thrombus from the axillary vein to the innominate vein.  After the thrombus was removed stenosis stricture of the innominate as well as the subclavian vein is identified  SPECIMEN(S):  none  INDICATIONS:    Patient is a 51 y.o. male who presents with a massively swollen very painful right arm.  Patient has marked right arm swelling and pain.  Venous intervention is performed to reduce the symtpoms and avoid long term postphlebitic symptoms.    DESCRIPTION: After obtaining full informed written consent, the patient was brought back to the vascular suite and placed supine upon the table. Moderate conscious sedation was administered during a face to face encounter with the patient throughout the procedure with my supervision of the RN administering medicines  and monitoring the patient's vital signs, pulse oximetry, telemetry and mental status throughout from the start of the procedure until the patient was taken to the recovery room.  After obtaining adequate anesthesia, the patient was prepped and draped in the standard fashion.     The patient was then placed into the supine position with the right arm extended.  The right brachial vein just below the level of the antecubital fossa was then accessed under direct ultrasound guidance without difficulty with a micropuncture needle and a permanent image was recorded.  Annual image was recorded for the permanent record and the puncture was made under direct ultrasound visualization.  I then upsized to an 7 Fr sheath over a J wire and subsequently an 11 [place] sheath.  4000 units of heparin were then given.  Imaging showed extensive DVT with minimal flow.  A Kumpe catheter and glide wire were then advanced into the superior vena cava and images were performed.    I was able to cross the thrombus and stenosis and advance into the SVC which was patent.  I then used the infusion catheter and instilled 10 mg  of tpa throughout the axillary, subclavian and innominate veins.  After this dwelled for 15 minutes, I used the Penumbra Cat 12 catheter and evacuated about 300 of effluent with mechanical thrombectomy throughout the axillary, subclavian and innominate veins.  This had significant improvement with removal of greater than 90% of the thrombus.  This uncovered a lengthy stricture extending from the distal subclavian into the innominate vein on the right.  I then treated the subclavian and innominate veins  with a 8 mm balloon 3 inflations were made to 10 to 12 atm.  Next a 12 mm balloon was utilized, again 3 separate inflations were required each to 6 to 8 atm.   Follow up imaging now demonstrated complete recanalization from the axillary vein through to the superior vena cava of the right upper extremity.  There is less  than 20% residual stenosis throughout with rapid flow of contrast and I elected to terminate the procedure.  COMPLICATIONS: None  CONDITION: Stable  Hortencia Pilar 07/29/2022 4:47 PM

## 2022-07-29 NOTE — Progress Notes (Signed)
PROGRESS NOTE    Cameron Prince  PPI:[id number] DOB: 05-10-1971 DOA: 07/26/2022  PCP: Patient, No Pcp Per   Brief Narrative:  This 51 year old with PMH significant for hypertension, diabetes type 2, ongoing tobacco use presented with 2 days history of swelling in right lateral neck and axillary area, extending to the right hand.  He has been recently incarcerated, he has not been on any medication and does not have insurance or PCP. Evaluation in the ED he was found to have significant blood clot extending from the right IJ and involving the subclavian, axillary cephalic, basilic brachial radial ulnar veins.  Case was discussed with vascular surgery who recommend heparin drip, plan for right upper extremity thrombectomy on Wednesday or Thursday. Patient was also found to have 2.4 cm.  Pleural-based nodule in the left upper lobe concerning for underlying malignancy.  Pulmonology consulted recommended patient should be on anticoagulation at least 6 to 8 weeks, needs bronchoscopy with navigational biopsy.  Assessment & Plan:   Principal Problem:   DVT (deep venous thrombosis) (HCC) Active Problems:   Lung nodule   MDD (major depressive disorder)   Hypertension   DMII (diabetes mellitus, type 2) (HCC)   Right upper extremity DVT: He presented with right arm swelling pain. He is found to have extensive DVT from right IJ involving subclavian, axillary, cephalic, basilic, brachial, radial, ulnar veins: Continue heparin drip Doppler Lower extremity: Negative for DVT>  Vascular consulted, and planning Thrombectomy on Thursday.  CM consulted to assist with Eliquis.    Lung nodule: Patient has history of tobacco use now presenting with a DVT. Hypercoagulability related to malignancy could be possible. CT chest showed 2.4 cm left upper lobe lung nodule, right supraclavicular enlarged lymph nodes measuring up to 1.5 cm. Pulmonology consulted. They will help arrange follow up out patient.    Patient will need navigational bronchoscopy.  He will need to receive at least 6 weeks of anticoagulation before procedure.  He has appointment schedule on 09/08/2022 at 2:00.    Type 2 diabetes: Continue regular insulin sliding scale   Essential hypertension: Blood pressure has been reasonably controlled.  Major depressive disorder: Not on any antidepressant medications.   Obesity: Estimated body mass index is 33.72 kg/m as calculated from the following:   Height as of this encounter: 6\' 4"  (1.93 m).   Weight as of this encounter: 125.6 kg.    DVT prophylaxis: Heparin drip Code Status: Full code Family Communication: No family at bedside Disposition Plan:  Status is: Inpatient Remains inpatient appropriate because: Admitted for extensive right upper extremity DVT in the setting of malignancy.  Patient is on heparin drip vascular surgery is planning to have thrombectomy 1 to 2 days.    Consultants:  Vascular surgery/pulmonology  Procedures: Scheduled thrombectomy for right upper extremity DVT Antimicrobials:  Anti-infectives (From admission, onward)    Start     Dose/Rate Route Frequency Ordered Stop   07/28/22 2248  ceFAZolin (ANCEF) IVPB 2g/100 mL premix        2 g 200 mL/hr over 30 Minutes Intravenous 30 min pre-op 07/28/22 2249     07/27/22 1445  amoxicillin-clavulanate (AUGMENTIN) 875-125 MG per tablet 1 tablet        1 tablet Oral Every 12 hours 07/27/22 1442          Subjective: Patient was seen and examined at bedside.  Overnight events noted.   Patient reports feeling better , he denies any pain,  his right arm is still significantly swollen.  Objective: Vitals:   07/28/22 2314 07/29/22 0401 07/29/22 0819 07/29/22 1213  BP: 126/78 122/77 (!) 143/84 (!) 170/107  Pulse: 77 79 77 72  Resp: 18 16 16 14   Temp: 98.8 F (37.1 C) 98.6 F (37 C) 97.9 F (36.6 C) 97.9 F (36.6 C)  TempSrc:      SpO2: 97% 98% 99% 94%  Weight:      Height:         Intake/Output Summary (Last 24 hours) at 07/29/2022 1213 Last data filed at 07/29/2022 0406 Gross per 24 hour  Intake 499.62 ml  Output 425 ml  Net 74.62 ml   Filed Weights   07/26/22 1250  Weight: 125.6 kg    Examination:  General exam: Appears comfortable, not in any acute distress Respiratory system: CTA bilaterally, respiratory effort normal, RR 15 Cardiovascular system: S1 & S2 heard, regular rate and rhythm, no murmur. Gastrointestinal system: Abdomen is soft, nontender, nondistended, BS+ Central nervous system: Alert and oriented x 3. No focal neurological deficits. Extremities: RUL extremely swollen but non tender. Skin: No rashes, lesions or ulcers Psychiatry: Judgement and insight appear normal. Mood & affect appropriate.     Data Reviewed: I have personally reviewed following labs and imaging studies  CBC: Recent Labs  Lab 07/26/22 1250 07/27/22 0155 07/28/22 0044 07/29/22 0504  WBC 10.0 9.3 11.5* 11.2*  NEUTROABS  --  4.7 5.9 5.8  HGB 15.1 13.6 13.2 12.7*  HCT 48.0 43.4 43.2 40.9  MCV 86.5 86.6 86.7 86.5  PLT 315 294 343 423   Basic Metabolic Panel: Recent Labs  Lab 07/26/22 1250 07/27/22 0155 07/28/22 0044 07/29/22 0504  NA 139 139 139 137  K 3.6 4.1 4.3 3.9  CL 108 105 107 107  CO2 25 28 28 25   GLUCOSE 138* 130* 123* 114*  BUN 12 11 11 9   CREATININE 1.21 1.23 1.24 0.86  CALCIUM 8.9 8.6* 8.6* 8.4*  MG  --  1.9 2.0 1.9   GFR: Estimated Creatinine Clearance: 147 mL/min (by C-G formula based on SCr of 0.86 mg/dL). Liver Function Tests: No results for input(s): "AST", "ALT", "ALKPHOS", "BILITOT", "PROT", "ALBUMIN" in the last 168 hours. No results for input(s): "LIPASE", "AMYLASE" in the last 168 hours. No results for input(s): "AMMONIA" in the last 168 hours. Coagulation Profile: No results for input(s): "INR", "PROTIME" in the last 168 hours. Cardiac Enzymes: No results for input(s): "CKTOTAL", "CKMB", "CKMBINDEX", "TROPONINI" in the  last 168 hours. BNP (last 3 results) No results for input(s): "PROBNP" in the last 8760 hours. HbA1C: Recent Labs    07/26/22 1303  HGBA1C 5.5   CBG: Recent Labs  Lab 07/28/22 0737 07/28/22 1155 07/28/22 1605 07/28/22 2118 07/29/22 0821  GLUCAP 137* 123* 127* 133* 81   Lipid Profile: No results for input(s): "CHOL", "HDL", "LDLCALC", "TRIG", "CHOLHDL", "LDLDIRECT" in the last 72 hours. Thyroid Function Tests: No results for input(s): "TSH", "T4TOTAL", "FREET4", "T3FREE", "THYROIDAB" in the last 72 hours. Anemia Panel: No results for input(s): "VITAMINB12", "FOLATE", "FERRITIN", "TIBC", "IRON", "RETICCTPCT" in the last 72 hours. Sepsis Labs: No results for input(s): "PROCALCITON", "LATICACIDVEN" in the last 168 hours.  Recent Results (from the past 240 hour(s))  Resp Panel by RT-PCR (Flu A&B, Covid) Anterior Nasal Swab     Status: None   Collection Time: 07/26/22  1:28 PM   Specimen: Anterior Nasal Swab  Result Value Ref Range Status   SARS Coronavirus 2 by RT PCR NEGATIVE NEGATIVE Final    Comment: (NOTE) SARS-CoV-2  target nucleic acids are NOT DETECTED.  The SARS-CoV-2 RNA is generally detectable in upper respiratory specimens during the acute phase of infection. The lowest concentration of SARS-CoV-2 viral copies this assay can detect is 138 copies/mL. A negative result does not preclude SARS-Cov-2 infection and should not be used as the sole basis for treatment or other patient management decisions. A negative result may occur with  improper specimen collection/handling, submission of specimen other than nasopharyngeal swab, presence of viral mutation(s) within the areas targeted by this assay, and inadequate number of viral copies(<138 copies/mL). A negative result must be combined with clinical observations, patient history, and epidemiological information. The expected result is Negative.  Fact Sheet for Patients:   BloggerCourse.com  Fact Sheet for Healthcare Providers:  SeriousBroker.it  This test is no t yet approved or cleared by the [place] FDA and  has been authorized for detection and/or diagnosis of SARS-CoV-2 by FDA under an Emergency Use Authorization (EUA). This EUA will remain  in effect (meaning this test can be used) for the duration of the COVID-19 declaration under Section 564(b)(1) of the Act, 21 U.S.C.section 360bbb-3(b)(1), unless the authorization is terminated  or revoked sooner.       Influenza A by PCR NEGATIVE NEGATIVE Final   Influenza B by PCR NEGATIVE NEGATIVE Final    Comment: (NOTE) The Xpert Xpress SARS-CoV-2/FLU/RSV plus assay is intended as an aid in the diagnosis of influenza from Nasopharyngeal swab specimens and should not be used as a sole basis for treatment. Nasal washings and aspirates are unacceptable for Xpert Xpress SARS-CoV-2/FLU/RSV testing.  Fact Sheet for Patients: BloggerCourse.com  Fact Sheet for Healthcare Providers: SeriousBroker.it  This test is not yet approved or cleared by the [place] FDA and has been authorized for detection and/or diagnosis of SARS-CoV-2 by FDA under an Emergency Use Authorization (EUA). This EUA will remain in effect (meaning this test can be used) for the duration of the COVID-19 declaration under Section 564(b)(1) of the Act, 21 U.S.C. section 360bbb-3(b)(1), unless the authorization is terminated or revoked.  Performed at [facility], [address] [place], [place] [address]   Group A Strep by PCR     Status: None   Collection Time: 07/26/22  1:28 PM   Specimen: Anterior Nasal Swab; Sterile Swab  Result Value Ref Range Status   Group A Strep by PCR NOT DETECTED NOT DETECTED Final    Comment: Performed at [facility], [address] [place], [place] [address]    Radiology  Studies: No results found.  Scheduled Meds:  amoxicillin-clavulanate  1 tablet Oral Q12H   insulin aspart  0-15 Units Subcutaneous TID WC   insulin aspart  0-5 Units Subcutaneous QHS   nicotine  7 mg Transdermal Daily   sodium chloride flush  3 mL Intravenous Q12H   Continuous Infusions:  sodium chloride 75 mL/hr at 07/29/22 0406    ceFAZolin (ANCEF) IV     heparin 1,400 Units/hr (07/29/22 0934)     LOS: 3 days    Time spent: 50 mins    Navea Woodrow, MD [facility]   If 7PM-7AM, please contact night-coverage

## 2022-07-29 NOTE — Consult Note (Signed)
ANTICOAGULATION CONSULT NOTE   Pharmacy Consult for heparin infusion Indication: DVT  Allergies  Allergen Reactions   Lisinopril Cough    Patient Measurements: Height: 6\' 4"  (193 cm) Weight: 125.6 kg (276 lb 14.4 oz) IBW/kg (Calculated) : 86.8 Heparin Dosing Weight: 113.6kg  Vital Signs: Temp: 98.8 F (37.1 C) (10/25 1400) Temp Source: Oral (10/25 1400) BP: 156/103 (10/25 1645) Pulse Rate: 92 (10/25 1700)  Labs: Recent Labs     0000 07/26/22 1950 07/27/22 0155 07/27/22 1139 07/27/22 1841 07/28/22 0044 07/29/22 0504  HGB   < >  --  13.6  --   --  13.2 12.7*  HCT  --   --  43.4  --   --  43.2 40.9  PLT  --   --  294  --   --  343 372  APTT  --  32  --   --   --   --   --   HEPARINUNFRC  --   --  >1.10*   < > 0.65 0.51 0.38  CREATININE  --   --  1.23  --   --  1.24 0.86   < > = values in this interval not displayed.     Estimated Creatinine Clearance: 147 mL/min (by C-G formula based on SCr of 0.86 mg/dL).   Medical History: Past Medical History:  Diagnosis Date   Hypertension     Medications:  PTA: N/A Inpatient: Heparin infusion (10/22 >) Allergies: No AC/APT related allergies  Assessment: 51 year old male with PMH MDD, T2DM, hypertension, lung nodule presents to ED with rich neck pain and right arm swelling. On imaging, US doppler study showing occlusive deep venous thrombosis in the right upper extremity.  Goal of Therapy:  Heparin level 0.3-0.7 units/ml Monitor platelets by anticoagulation protocol: Yes  Date Time aPTT/HL Rate/Comment 10/23 0155 HL >1.1 Supratherapeutic  10/23 1139 HL 0.85 Supratherapeutic 10/23 1841 HL 0.65 Therapeutic x 1 @ 1400 units/hr 10/24 0044 HL 0.51 Therapeutic x 2 @ 1400 units/hr 10/25 0504 HL 0.38 Therapeutic x 3 @ 1400 units/hr 10/25 1500-1800  Stopped for thrombectomy   Plan:  Resume heparin at previous therapeutic rate of 1400 units/hr Recheck HL 6 hours following resuming to ensure remains therapeutic  CBC  daily while on heparin   Dorothe Pea, PharmD, BCPS Clinical Pharmacist   07/29/2022 5:02 PM

## 2022-07-30 ENCOUNTER — Encounter: Payer: Self-pay | Admitting: Vascular Surgery

## 2022-07-30 DIAGNOSIS — I82621 Acute embolism and thrombosis of deep veins of right upper extremity: Secondary | ICD-10-CM | POA: Diagnosis not present

## 2022-07-30 LAB — CBC WITH DIFFERENTIAL/PLATELET
Abs Immature Granulocytes: 0.04 10*3/uL (ref 0.00–0.07)
Basophils Absolute: 0.1 10*3/uL (ref 0.0–0.1)
Basophils Relative: 1 %
Eosinophils Absolute: 0.3 10*3/uL (ref 0.0–0.5)
Eosinophils Relative: 3 %
HCT: 39.5 % (ref 39.0–52.0)
Hemoglobin: 12.3 g/dL — ABNORMAL LOW (ref 13.0–17.0)
Immature Granulocytes: 0 %
Lymphocytes Relative: 20 %
Lymphs Abs: 2.1 10*3/uL (ref 0.7–4.0)
MCH: 26.6 pg (ref 26.0–34.0)
MCHC: 31.1 g/dL (ref 30.0–36.0)
MCV: 85.5 fL (ref 80.0–100.0)
Monocytes Absolute: 2.1 10*3/uL — ABNORMAL HIGH (ref 0.1–1.0)
Monocytes Relative: 20 %
Neutro Abs: 5.9 10*3/uL (ref 1.7–7.7)
Neutrophils Relative %: 56 %
Platelets: 387 10*3/uL (ref 150–400)
RBC: 4.62 MIL/uL (ref 4.22–5.81)
RDW: 14.3 % (ref 11.5–15.5)
WBC: 10.5 10*3/uL (ref 4.0–10.5)
nRBC: 0 % (ref 0.0–0.2)

## 2022-07-30 LAB — GLUCOSE, CAPILLARY
Glucose-Capillary: 130 mg/dL — ABNORMAL HIGH (ref 70–99)
Glucose-Capillary: 117 mg/dL — ABNORMAL HIGH (ref 70–99)
Glucose-Capillary: 112 mg/dL — ABNORMAL HIGH (ref 70–99)
Glucose-Capillary: 129 mg/dL — ABNORMAL HIGH (ref 70–99)

## 2022-07-30 LAB — BASIC METABOLIC PANEL
Anion gap: 7 (ref 5–15)
BUN: 7 mg/dL (ref 6–20)
CO2: 23 mmol/L (ref 22–32)
Calcium: 8.4 mg/dL — ABNORMAL LOW (ref 8.9–10.3)
Chloride: 106 mmol/L (ref 98–111)
Creatinine, Ser: 0.87 mg/dL (ref 0.61–1.24)
GFR, Estimated: >60 mL/min (ref >60)
Glucose, Bld: 110 mg/dL — ABNORMAL HIGH (ref 70–99)
Potassium: 3.7 mmol/L (ref 3.5–5.1)
Sodium: 136 mmol/L (ref 135–145)

## 2022-07-30 LAB — MAGNESIUM: Magnesium: 1.9 mg/dL (ref 1.7–2.4)

## 2022-07-30 LAB — HEPARIN LEVEL (UNFRACTIONATED)
Heparin Unfractionated: 0.17 IU/mL — ABNORMAL LOW (ref 0.30–0.70)
Heparin Unfractionated: 0.43 IU/mL (ref 0.30–0.70)
Heparin Unfractionated: 0.51 IU/mL (ref 0.30–0.70)

## 2022-07-30 MED ORDER — HEPARIN BOLUS VIA INFUSION
3500.0000 [IU] | Freq: Once | INTRAVENOUS | Status: AC
  Administered 2022-07-30: 3500 [IU] via INTRAVENOUS
  Filled 2022-07-30: qty 3500

## 2022-07-30 NOTE — Consult Note (Signed)
ANTICOAGULATION CONSULT NOTE   Pharmacy Consult for heparin infusion Indication: DVT  Allergies  Allergen Reactions   Lisinopril Cough    Patient Measurements: Height: 6\' 4"  (193 cm) Weight: 125.6 kg (276 lb 14.4 oz) IBW/kg (Calculated) : 86.8 Heparin Dosing Weight: 113.6kg  Vital Signs: Temp: 98.5 F (36.9 C) (10/26 1223) Temp Source: Oral (10/26 0427) BP: 136/93 (10/26 1223) Pulse Rate: 90 (10/26 1223)  Labs: Recent Labs    07/28/22 0044 07/29/22 0504 07/29/22 2351 07/30/22 0548 07/30/22 1314  HGB 13.2 12.7*  --  12.3*  --   HCT 43.2 40.9  --  39.5  --   PLT 343 372  --  387  --   HEPARINUNFRC 0.51 0.38 0.17* 0.43 0.51  CREATININE 1.24 0.86  --  0.87  --      Estimated Creatinine Clearance: 145.3 mL/min (by C-G formula based on SCr of 0.87 mg/dL).   Medical History: Past Medical History:  Diagnosis Date   Hypertension     Medications:  PTA: N/A Inpatient: Heparin infusion (10/22 >) Allergies: No AC/APT related allergies  Assessment: 51 year old male with PMH MDD, T2DM, hypertension, lung nodule presents to ED with rich neck pain and right arm swelling. On imaging, US doppler study showing occlusive deep venous thrombosis in the right upper extremity.  Goal of Therapy:  Heparin level 0.3-0.7 units/ml Monitor platelets by anticoagulation protocol: Yes  Date Time aPTT/HL Rate/Comment 10/23 1841 HL 0.65 Therapeutic x 1 @ 1400 units/hr 10/24 0044 HL 0.51 Therapeutic x 2 @ 1400 units/hr 10/25 0504 HL 0.38 Therapeutic x 3 @ 1400 units/hr 10/25 1500-1800  Stopped for thrombectomy  10/25 2351 HL 0.17 Subtherapeutic, increase to 1700 units/hr 10/26 0548 HL 0.43 Therapeutic x 1, @1700  units/hr 10/26 1314 HL 0.51 Therapeutic x 2, @1700  units/hr  Plan:  --Continue heparin at current rate of 1700 units/hr --Recheck HL daily with AM labs --CBC daily while on heparin  Lorin Picket, PharmD Clinical Pharmacist   07/30/2022 1:42 PM

## 2022-07-30 NOTE — Progress Notes (Signed)
PROGRESS NOTE    Cameron Prince  WNU:[id number] DOB: 01/03/1971 DOA: 07/26/2022  PCP: Patient, No Pcp Per   Brief Narrative:  This 51 year old with PMH significant for hypertension, diabetes type 2, ongoing tobacco use presented with 2 days history of swelling in right lateral neck and axillary area, extending to the right hand.  He has been recently incarcerated, he has not been on any medication and does not have insurance or PCP. Evaluation in the ED he was found to have significant blood clot extending from the right IJ and involving the subclavian, axillary cephalic, basilic brachial radial ulnar veins.  Case was discussed with vascular surgery who recommend heparin drip, plan for right upper extremity thrombectomy on Wednesday or Thursday. Patient was also found to have 2.4 cm.  Pleural-based nodule in the left upper lobe concerning for underlying malignancy.  Pulmonology consulted recommended patient should be on anticoagulation at least 6 to 8 weeks, needs bronchoscopy with navigational biopsy. Patient underwent successful RUE thrombectomy.  He is continued on heparin drip.  Assessment & Plan:   Principal Problem:   DVT (deep venous thrombosis) (HCC) Active Problems:   Lung nodule   MDD (major depressive disorder)   Hypertension   DMII (diabetes mellitus, type 2) (HCC)   Right upper extremity DVT: He presented with right arm swelling  and pain. He is found to have extensive DVT from right IJ involving subclavian, axillary, cephalic, basilic, brachial, radial, ulnar veins: Continue heparin drip Doppler Lower extremity: Negative for DVT>  Vascular consulted, and underwent successful thrombectomy 07/29/2022 Continue heparin drip for now.  Patient reports  reduced swelling and pain. CM consulted to assist with Eliquis.    Lung nodule: Patient has history of tobacco use now presenting with thromboembolism. Hypercoagulability related to malignancy could be possible. CT chest  showed 2.4 cm left upper lobe lung nodule, right supraclavicular enlarged lymph nodes measuring up to 1.5 cm. Pulmonology consulted. They will help arrange follow up out patient.   Patient will need navigational bronchoscopy.  He will need to receive at least 6 weeks of anticoagulation before procedure.  He has appointment schedule on 09/08/2022 at 2:00.    Type 2 diabetes: Continue regular insulin sliding scale   Essential hypertension: Blood pressure has been reasonably controlled.  Major depressive disorder: Not on any antidepressant medications.   Obesity: Estimated body mass index is 33.72 kg/m as calculated from the following:   Height as of this encounter: 6\' 4"  (1.93 m).   Weight as of this encounter: 125.6 kg.    DVT prophylaxis: Heparin drip Code Status: Full code Family Communication: No family at bedside Disposition Plan:  Status is: Inpatient Remains inpatient appropriate because: Admitted for extensive right upper extremity DVT in the setting of malignancy.  Patient is on heparin drip,  vascular surgery consulted, He underwent RUE thrombectomy,  tolerated well,  right arm swelling is improving    Consultants:  Vascular surgery/pulmonology  Procedures: Scheduled thrombectomy for right upper extremity DVT Antimicrobials:  Anti-infectives (From admission, onward)    Start     Dose/Rate Route Frequency Ordered Stop   07/29/22 1525  ceFAZolin (ANCEF) IVPB 1 g/50 mL premix  Status:  Discontinued        over 30 Minutes  Continuous PRN 07/29/22 1529 07/29/22 1700   07/29/22 1522  ceFAZolin (ANCEF) 2-4 GM/100ML-% IVPB       Note to Pharmacy: Delametter, Gretchen: cabinet override      07/29/22 1522 07/29/22 1712   07/28/22 2248  ceFAZolin (ANCEF) IVPB 2g/100 mL premix  Status:  Discontinued        2 g 200 mL/hr over 30 Minutes Intravenous 30 min pre-op 07/28/22 2249 07/29/22 1700   07/27/22 1445  amoxicillin-clavulanate (AUGMENTIN) 875-125 MG per tablet 1 tablet         1 tablet Oral Every 12 hours 07/27/22 1442          Subjective: Patient was seen and examined at bedside.  Overnight events noted.   Patient reports feeling much better. He underwent RUE thrombectomy,  tolerated well. He continued on heparin.  He states right arm swelling and pain is improving   Objective: Vitals:   07/29/22 1715 07/29/22 1744 07/29/22 2032 07/30/22 0427  BP: (!) 147/99 135/86 (!) 172/97 126/79  Pulse:  (!) 101 (!) 101 84  Resp: (!) 30 20    Temp:  98.9 F (37.2 C) 99.6 F (37.6 C) 99.3 F (37.4 C)  TempSrc:  Oral Oral Oral  SpO2: 96% 98% 96% 95%  Weight:      Height:        Intake/Output Summary (Last 24 hours) at 07/30/2022 1108 Last data filed at 07/30/2022 0410 Gross per 24 hour  Intake 1409.7 ml  Output 1815 ml  Net -405.3 ml   Filed Weights   07/26/22 1250 07/29/22 1400  Weight: 125.6 kg 125.6 kg    Examination:  General exam: Appears comfortable, NAD, reports feeling better Respiratory system: CTA bilaterally, respiratory effort normal, RR 15 Cardiovascular system: S1 & S2 heard, regular rate and rhythm, no murmur. Gastrointestinal system: Abdomen is soft, non tender, non distended, BS+ Central nervous system: Alert and oriented x 3. No focal neurological deficits. Extremities: RUE swelling is improving, reports mild soreness.  Dressing noted Skin: No rashes, lesions or ulcers Psychiatry: Judgement and insight appear normal. Mood & affect appropriate.     Data Reviewed: I have personally reviewed following labs and imaging studies  CBC: Recent Labs  Lab 07/26/22 1250 07/27/22 0155 07/28/22 0044 07/29/22 0504 07/30/22 0548  WBC 10.0 9.3 11.5* 11.2* 10.5  NEUTROABS  --  4.7 5.9 5.8 5.9  HGB 15.1 13.6 13.2 12.7* 12.3*  HCT 48.0 43.4 43.2 40.9 39.5  MCV 86.5 86.6 86.7 86.5 85.5  PLT 315 294 343 372 387   Basic Metabolic Panel: Recent Labs  Lab 07/26/22 1250 07/27/22 0155 07/28/22 0044 07/29/22 0504 07/30/22 0548  NA  139 139 139 137 136  K 3.6 4.1 4.3 3.9 3.7  CL 108 105 107 107 106  CO2 25 28 28 25 23   GLUCOSE 138* 130* 123* 114* 110*  BUN 12 11 11 9 7   CREATININE 1.21 1.23 1.24 0.86 0.87  CALCIUM 8.9 8.6* 8.6* 8.4* 8.4*  MG  --  1.9 2.0 1.9 1.9   GFR: Estimated Creatinine Clearance: 145.3 mL/min (by C-G formula based on SCr of 0.87 mg/dL). Liver Function Tests: No results for input(s): "AST", "ALT", "ALKPHOS", "BILITOT", "PROT", "ALBUMIN" in the last 168 hours. No results for input(s): "LIPASE", "AMYLASE" in the last 168 hours. No results for input(s): "AMMONIA" in the last 168 hours. Coagulation Profile: No results for input(s): "INR", "PROTIME" in the last 168 hours. Cardiac Enzymes: No results for input(s): "CKTOTAL", "CKMB", "CKMBINDEX", "TROPONINI" in the last 168 hours. BNP (last 3 results) No results for input(s): "PROBNP" in the last 8760 hours. HbA1C: No results for input(s): "HGBA1C" in the last 72 hours.  CBG: Recent Labs  Lab 07/29/22 1215 07/29/22 1407 07/29/22 1650 07/29/22  2034 07/30/22 0822  GLUCAP 100* 96 76 155* 130*   Lipid Profile: No results for input(s): "CHOL", "HDL", "LDLCALC", "TRIG", "CHOLHDL", "LDLDIRECT" in the last 72 hours. Thyroid Function Tests: No results for input(s): "TSH", "T4TOTAL", "FREET4", "T3FREE", "THYROIDAB" in the last 72 hours. Anemia Panel: No results for input(s): "VITAMINB12", "FOLATE", "FERRITIN", "TIBC", "IRON", "RETICCTPCT" in the last 72 hours. Sepsis Labs: No results for input(s): "PROCALCITON", "LATICACIDVEN" in the last 168 hours.  Recent Results (from the past 240 hour(s))  Resp Panel by RT-PCR (Flu A&B, Covid) Anterior Nasal Swab     Status: None   Collection Time: 07/26/22  1:28 PM   Specimen: Anterior Nasal Swab  Result Value Ref Range Status   SARS Coronavirus 2 by RT PCR NEGATIVE NEGATIVE Final    Comment: (NOTE) SARS-CoV-2 target nucleic acids are NOT DETECTED.  The SARS-CoV-2 RNA is generally detectable in upper  respiratory specimens during the acute phase of infection. The lowest concentration of SARS-CoV-2 viral copies this assay can detect is 138 copies/mL. A negative result does not preclude SARS-Cov-2 infection and should not be used as the sole basis for treatment or other patient management decisions. A negative result may occur with  improper specimen collection/handling, submission of specimen other than nasopharyngeal swab, presence of viral mutation(s) within the areas targeted by this assay, and inadequate number of viral copies(<138 copies/mL). A negative result must be combined with clinical observations, patient history, and epidemiological information. The expected result is Negative.  Fact Sheet for Patients:  BloggerCourse.com  Fact Sheet for Healthcare Providers:  SeriousBroker.it  This test is no t yet approved or cleared by the [place] FDA and  has been authorized for detection and/or diagnosis of SARS-CoV-2 by FDA under an Emergency Use Authorization (EUA). This EUA will remain  in effect (meaning this test can be used) for the duration of the COVID-19 declaration under Section 564(b)(1) of the Act, 21 U.S.C.section 360bbb-3(b)(1), unless the authorization is terminated  or revoked sooner.       Influenza A by PCR NEGATIVE NEGATIVE Final   Influenza B by PCR NEGATIVE NEGATIVE Final    Comment: (NOTE) The Xpert Xpress SARS-CoV-2/FLU/RSV plus assay is intended as an aid in the diagnosis of influenza from Nasopharyngeal swab specimens and should not be used as a sole basis for treatment. Nasal washings and aspirates are unacceptable for Xpert Xpress SARS-CoV-2/FLU/RSV testing.  Fact Sheet for Patients: BloggerCourse.com  Fact Sheet for Healthcare Providers: SeriousBroker.it  This test is not yet approved or cleared by the [place] FDA and has been  authorized for detection and/or diagnosis of SARS-CoV-2 by FDA under an Emergency Use Authorization (EUA). This EUA will remain in effect (meaning this test can be used) for the duration of the COVID-19 declaration under Section 564(b)(1) of the Act, 21 U.S.C. section 360bbb-3(b)(1), unless the authorization is terminated or revoked.  Performed at [facility], [address] [place], [place] [address]   Group A Strep by PCR     Status: None   Collection Time: 07/26/22  1:28 PM   Specimen: Anterior Nasal Swab; Sterile Swab  Result Value Ref Range Status   Group A Strep by PCR NOT DETECTED NOT DETECTED Final    Comment: Performed at [facility], [address], [place], [place] [address]    Radiology Studies: PERIPHERAL VASCULAR CATHETERIZATION  Result Date: 07/29/2022 See surgical note for result.   Scheduled Meds:  amoxicillin-clavulanate  1 tablet Oral Q12H   insulin aspart  0-15 Units Subcutaneous TID WC   insulin aspart  0-5 Units Subcutaneous QHS   nicotine  7 mg Transdermal Daily   sodium chloride flush  3 mL Intravenous Q12H   Continuous Infusions:  heparin 1,700 Units/hr (07/30/22 0520)     LOS: 4 days    Time spent: 35 mins    Ashlee Player, MD [facility]   If 7PM-7AM, please contact night-coverage

## 2022-07-30 NOTE — Progress Notes (Signed)
[facility]  Daily Progress Note   Subjective  -   Patient send right upper extremity swelling is improved postintervention.  He notes that the discomfort is largely resolved.  Objective Vitals:   07/30/22 0427 07/30/22 1223 07/30/22 1712 07/30/22 1944  BP: 126/79 (!) 136/93 (!) 148/85 127/78  Pulse: 84 90 88 84  Resp:  16 16 18   Temp: 99.3 F (37.4 C) 98.5 F (36.9 C) 98.9 F (37.2 C) 99 F (37.2 C)  TempSrc: Oral     SpO2: 95% 99% 97% 97%  Weight:      Height:        Intake/Output Summary (Last 24 hours) at 07/30/2022 2238 Last data filed at 07/30/2022 1700 Gross per 24 hour  Intake 240 ml  Output 715 ml  Net -475 ml    PULM  CTAB CV  RRR VASC  2+ right upper extremity pulse, swelling  Laboratory CBC    Component Value Date/Time   WBC 10.5 07/30/2022 0548   HGB 12.3 (L) 07/30/2022 0548   HCT 39.5 07/30/2022 0548   PLT 387 07/30/2022 0548    BMET    Component Value Date/Time   NA 136 07/30/2022 0548   K 3.7 07/30/2022 0548   CL 106 07/30/2022 0548   CO2 23 07/30/2022 0548   GLUCOSE 110 (H) 07/30/2022 0548   BUN 7 07/30/2022 0548   CREATININE 0.87 07/30/2022 0548   CALCIUM 8.4 (L) 07/30/2022 0548   GFRNONAA >60 07/30/2022 0548   GFRAA >60 12/05/2018 1017    Assessment/Planning: POD #1 s/p right upper extremity thrombectomy  Patient is post successful right upper extremity thrombectomy, patient is currently stable from vascular standpoint for discharge.  Patient discharged on 5 mg of Eliquis twice daily.  He should continue to follow-up with pulmonology for work-up of malignancy.  Patient should follow-up with [place] in our office in approximately 4 weeks with right upper extremity DVT study.   Kris Hartmann  07/30/2022, 10:38 PM

## 2022-07-30 NOTE — Consult Note (Signed)
ANTICOAGULATION CONSULT NOTE   Pharmacy Consult for heparin infusion Indication: DVT  Allergies  Allergen Reactions   Lisinopril Cough    Patient Measurements: Height: 6\' 4"  (193 cm) Weight: 125.6 kg (276 lb 14.4 oz) IBW/kg (Calculated) : 86.8 Heparin Dosing Weight: 113.6kg  Vital Signs: Temp: 99.3 F (37.4 C) (10/26 0427) Temp Source: Oral (10/26 0427) BP: 126/79 (10/26 0427) Pulse Rate: 84 (10/26 0427)  Labs: Recent Labs    07/28/22 0044 07/29/22 0504 07/29/22 2351 07/30/22 0548  HGB 13.2 12.7*  --  12.3*  HCT 43.2 40.9  --  39.5  PLT 343 372  --  387  HEPARINUNFRC 0.51 0.38 0.17* 0.43  CREATININE 1.24 0.86  --  0.87     Estimated Creatinine Clearance: 145.3 mL/min (by C-G formula based on SCr of 0.87 mg/dL).   Medical History: Past Medical History:  Diagnosis Date   Hypertension     Medications:  PTA: N/A Inpatient: Heparin infusion (10/22 >) Allergies: No AC/APT related allergies  Assessment: 51 year old male with PMH MDD, T2DM, hypertension, lung nodule presents to ED with rich neck pain and right arm swelling. On imaging, US doppler study showing occlusive deep venous thrombosis in the right upper extremity.  Goal of Therapy:  Heparin level 0.3-0.7 units/ml Monitor platelets by anticoagulation protocol: Yes  Date Time aPTT/HL Rate/Comment 10/23 1841 HL 0.65 Therapeutic x 1 @ 1400 units/hr 10/24 0044 HL 0.51 Therapeutic x 2 @ 1400 units/hr 10/25 0504 HL 0.38 Therapeutic x 3 @ 1400 units/hr 10/25 1500-1800  Stopped for thrombectomy  10/25 2351 HL 0.17 Subtherapeutic, increase to 1700 units/hr 10/26 0548 HL 0.43 Therapeutic x 1, @1700  units/hr  Plan:  --Continue heparin at current rate of 1700 units/hr --Recheck HL in 6 hours --CBC daily while on heparin  Lorin Picket, PharmD Clinical Pharmacist   07/30/2022 8:26 AM

## 2022-07-31 ENCOUNTER — Other Ambulatory Visit (HOSPITAL_COMMUNITY): Payer: Self-pay

## 2022-07-31 ENCOUNTER — Telehealth (HOSPITAL_COMMUNITY): Payer: Self-pay | Admitting: Pharmacy Technician

## 2022-07-31 DIAGNOSIS — I82621 Acute embolism and thrombosis of deep veins of right upper extremity: Secondary | ICD-10-CM | POA: Diagnosis not present

## 2022-07-31 LAB — CBC WITH DIFFERENTIAL/PLATELET
Abs Immature Granulocytes: 0.04 10*3/uL (ref 0.00–0.07)
Basophils Absolute: 0.1 10*3/uL (ref 0.0–0.1)
Basophils Relative: 1 %
Eosinophils Absolute: 0.4 10*3/uL (ref 0.0–0.5)
Eosinophils Relative: 4 %
HCT: 38.5 % — ABNORMAL LOW (ref 39.0–52.0)
Hemoglobin: 12 g/dL — ABNORMAL LOW (ref 13.0–17.0)
Immature Granulocytes: 0 %
Lymphocytes Relative: 21 %
Lymphs Abs: 2 10*3/uL (ref 0.7–4.0)
MCH: 26.7 pg (ref 26.0–34.0)
MCHC: 31.2 g/dL (ref 30.0–36.0)
MCV: 85.6 fL (ref 80.0–100.0)
Monocytes Absolute: 1.9 10*3/uL — ABNORMAL HIGH (ref 0.1–1.0)
Monocytes Relative: 20 %
Neutro Abs: 4.9 10*3/uL (ref 1.7–7.7)
Neutrophils Relative %: 54 %
Platelets: 394 10*3/uL (ref 150–400)
RBC: 4.5 MIL/uL (ref 4.22–5.81)
RDW: 14.2 % (ref 11.5–15.5)
WBC: 9.3 10*3/uL (ref 4.0–10.5)
nRBC: 0 % (ref 0.0–0.2)

## 2022-07-31 LAB — BASIC METABOLIC PANEL
Anion gap: 8 (ref 5–15)
BUN: 9 mg/dL (ref 6–20)
CO2: 23 mmol/L (ref 22–32)
Calcium: 8.5 mg/dL — ABNORMAL LOW (ref 8.9–10.3)
Chloride: 107 mmol/L (ref 98–111)
Creatinine, Ser: 0.84 mg/dL (ref 0.61–1.24)
GFR, Estimated: >60 mL/min (ref >60)
Glucose, Bld: 106 mg/dL — ABNORMAL HIGH (ref 70–99)
Potassium: 3.7 mmol/L (ref 3.5–5.1)
Sodium: 138 mmol/L (ref 135–145)

## 2022-07-31 LAB — MAGNESIUM: Magnesium: 2.1 mg/dL (ref 1.7–2.4)

## 2022-07-31 LAB — HEPARIN LEVEL (UNFRACTIONATED): Heparin Unfractionated: 0.36 IU/mL (ref 0.30–0.70)

## 2022-07-31 LAB — GLUCOSE, CAPILLARY
Glucose-Capillary: 122 mg/dL — ABNORMAL HIGH (ref 70–99)
Glucose-Capillary: 130 mg/dL — ABNORMAL HIGH (ref 70–99)

## 2022-07-31 MED ORDER — APIXABAN 5 MG PO TABS
10.0000 mg | ORAL_TABLET | Freq: Two times a day (BID) | ORAL | Status: DC
  Administered 2022-07-31: 10 mg via ORAL
  Filled 2022-07-31: qty 2

## 2022-07-31 MED ORDER — APIXABAN 5 MG PO TABS: 5.0000 mg | ORAL_TABLET | Freq: Two times a day (BID) | ORAL | Status: DC

## 2022-07-31 MED ORDER — APIXABAN 5 MG PO TABS: ORAL_TABLET | ORAL | 3 refills | Status: DC

## 2022-07-31 MED ORDER — HYDROCODONE-ACETAMINOPHEN 5-325 MG PO TABS: 1.0000 | ORAL_TABLET | Freq: Four times a day (QID) | ORAL | 0 refills | Status: AC | PRN

## 2022-07-31 NOTE — Discharge Instructions (Signed)
Advised to follow-up with primary care physician in 1 week. Advised to follow-up with vascular surgery in 4 weeks. Advised to follow-up with pulmonology for outpatient work-up for malignancy. Advised to take Eliquis 5 mg twice daily for DVT

## 2022-07-31 NOTE — Telephone Encounter (Signed)
Patient Advocate Encounter  Prior Authorization for Eliquis 5MG  tablets has been approved.    PA# [id number] Key: [id number] Effective dates: 07/31/2022 through 07/31/2023  Patients co-pay is $15.00.     Lyndel Safe, [place] Patient Advocate Specialist [place] Patient Advocate Team Direct Number: [phone]  Fax: [phone]

## 2022-07-31 NOTE — Progress Notes (Signed)
Discharge instructions given to patient by this RN. AVS provided to patient. Patient dc'd with significant other via car. IV removed. Patient stable at this time with no complaints.

## 2022-07-31 NOTE — TOC Benefit Eligibility Note (Signed)
Patient Cameron Prince, English as a foreign language completed.    The patient is currently admitted and upon discharge could be taking Eliquis Starter Pack.  Requires Prior Authorizationt  The patient is insured through [place], [place] Patient Advocate Specialist [place] Patient Advocate Team Direct Number: [phone]  Fax: [phone]

## 2022-07-31 NOTE — Telephone Encounter (Signed)
Pharmacy Patient Advocate Encounter  Insurance verification completed.    The patient is insured through [employer]   The patient is currently admitted and ran test claims for the following: Eliquis .  Copays and coinsurance results were relayed to Inpatient clinical team.  

## 2022-07-31 NOTE — Consult Note (Signed)
ANTICOAGULATION CONSULT NOTE   Pharmacy Consult for heparin infusion Indication: DVT  Allergies  Allergen Reactions   Lisinopril Cough    Patient Measurements: Height: 6\' 4"  (193 cm) Weight: 125.6 kg (276 lb 14.4 oz) IBW/kg (Calculated) : 86.8 Heparin Dosing Weight: 113.6kg  Vital Signs: Temp: 98.9 F (37.2 C) (10/27 0317) BP: 135/80 (10/27 0317) Pulse Rate: 72 (10/27 0317)  Labs: Recent Labs    07/29/22 0504 07/29/22 2351 07/30/22 0548 07/30/22 1314 07/31/22 0536  HGB 12.7*  --  12.3*  --  12.0*  HCT 40.9  --  39.5  --  38.5*  PLT 372  --  387  --  394  HEPARINUNFRC 0.38   < > 0.43 0.51 0.36  CREATININE 0.86  --  0.87  --   --    < > = values in this interval not displayed.     Estimated Creatinine Clearance: 145.3 mL/min (by C-G formula based on SCr of 0.87 mg/dL).   Medical History: Past Medical History:  Diagnosis Date   Hypertension     Medications:  PTA: N/A Inpatient: Heparin infusion (10/22 >) Allergies: No AC/APT related allergies  Assessment: 51 year old male with PMH MDD, T2DM, hypertension, lung nodule presents to ED with rich neck pain and right arm swelling. On imaging, US doppler study showing occlusive deep venous thrombosis in the right upper extremity.  Goal of Therapy:  Heparin level 0.3-0.7 units/ml Monitor platelets by anticoagulation protocol: Yes  Date Time aPTT/HL Rate/Comment 10/23 1841 HL 0.65 Therapeutic x 1 @ 1400 units/hr 10/24 0044 HL 0.51 Therapeutic x 2 @ 1400 units/hr 10/25 0504 HL 0.38 Therapeutic x 3 @ 1400 units/hr 10/25 1500-1800  Stopped for thrombectomy  10/25 2351 HL 0.17 Subtherapeutic, increase to 1700 units/hr 10/26 0548 HL 0.43 Therapeutic x 1, @1700  units/hr 10/26 1314 HL 0.51 Therapeutic x 2, @1700  units/hr 10/27 0536 HL 0.36 Therapeutic x 3, @1700  units/hr  Plan:  --Continue heparin at current rate of 1700 units/hr --Recheck HL daily with AM labs --CBC daily while on heparin  Pearla Dubonnet,  PharmD Clinical Pharmacist   07/31/2022 6:39 AM
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
## 2022-08-26 ENCOUNTER — Other Ambulatory Visit: Payer: Self-pay

## 2022-08-26 ENCOUNTER — Emergency Department
Admission: EM | Admit: 2022-08-26 | Discharge: 2022-08-27 | Disposition: A | Discharge status: Home / Self Care | Payer: Commercial Managed Care - HMO | Attending: Emergency Medicine | Admitting: Emergency Medicine

## 2022-08-26 ENCOUNTER — Emergency Department: Payer: Commercial Managed Care - HMO

## 2022-08-26 DIAGNOSIS — R45851 Suicidal ideations: Secondary | ICD-10-CM | POA: Insufficient documentation

## 2022-08-26 DIAGNOSIS — R1084 Generalized abdominal pain: Secondary | ICD-10-CM | POA: Diagnosis not present

## 2022-08-26 DIAGNOSIS — F1420 Cocaine dependence, uncomplicated: Secondary | ICD-10-CM | POA: Diagnosis present

## 2022-08-26 DIAGNOSIS — F17210 Nicotine dependence, cigarettes, uncomplicated: Secondary | ICD-10-CM | POA: Diagnosis not present

## 2022-08-26 DIAGNOSIS — G8929 Other chronic pain: Secondary | ICD-10-CM | POA: Diagnosis not present

## 2022-08-26 DIAGNOSIS — F32A Depression, unspecified: Secondary | ICD-10-CM | POA: Diagnosis not present

## 2022-08-26 DIAGNOSIS — R109 Unspecified abdominal pain: Secondary | ICD-10-CM | POA: Diagnosis present

## 2022-08-26 DIAGNOSIS — R112 Nausea with vomiting, unspecified: Secondary | ICD-10-CM | POA: Insufficient documentation

## 2022-08-26 DIAGNOSIS — R110 Nausea: Secondary | ICD-10-CM

## 2022-08-26 DIAGNOSIS — I10 Essential (primary) hypertension: Secondary | ICD-10-CM | POA: Insufficient documentation

## 2022-08-26 HISTORY — DX: Post-traumatic stress disorder, unspecified: F43.10

## 2022-08-26 HISTORY — DX: Depression, unspecified: F32.A

## 2022-08-26 LAB — TROPONIN I (HIGH SENSITIVITY)
Troponin I (High Sensitivity): 10 ng/L (ref <18)
Troponin I (High Sensitivity): 8 ng/L (ref <18)

## 2022-08-26 LAB — URINALYSIS, ROUTINE W REFLEX MICROSCOPIC
Bilirubin Urine: NEGATIVE
Glucose, UA: NEGATIVE mg/dL
Hgb urine dipstick: NEGATIVE
Ketones, ur: 20 mg/dL — AB
Leukocytes,Ua: NEGATIVE
Nitrite: NEGATIVE
Protein, ur: 30 mg/dL — AB
Specific Gravity, Urine: 1.033 — ABNORMAL HIGH (ref 1.005–1.030)
Squamous Epithelial / HPF: NONE SEEN (ref 0–5)
pH: 5 (ref 5.0–8.0)

## 2022-08-26 LAB — COMPREHENSIVE METABOLIC PANEL
ALT: 19 U/L (ref 0–44)
AST: 26 U/L (ref 15–41)
Albumin: 3.5 g/dL (ref 3.5–5.0)
Alkaline Phosphatase: 47 U/L (ref 38–126)
Anion gap: 9 (ref 5–15)
BUN: 14 mg/dL (ref 6–20)
CO2: 23 mmol/L (ref 22–32)
Calcium: 9.2 mg/dL (ref 8.9–10.3)
Chloride: 106 mmol/L (ref 98–111)
Creatinine, Ser: 0.95 mg/dL (ref 0.61–1.24)
GFR, Estimated: >60 mL/min (ref >60)
Glucose, Bld: 182 mg/dL — ABNORMAL HIGH (ref 70–99)
Potassium: 3.4 mmol/L — ABNORMAL LOW (ref 3.5–5.1)
Sodium: 138 mmol/L (ref 135–145)
Total Bilirubin: 1.8 mg/dL — ABNORMAL HIGH (ref 0.3–1.2)
Total Protein: 7.5 g/dL (ref 6.5–8.1)

## 2022-08-26 LAB — URINE DRUG SCREEN, QUALITATIVE (ARMC ONLY)
Amphetamines, Ur Screen: NOT DETECTED
Barbiturates, Ur Screen: NOT DETECTED
Benzodiazepine, Ur Scrn: NOT DETECTED
Cannabinoid 50 Ng, Ur ~~LOC~~: POSITIVE — AB
Cocaine Metabolite,Ur ~~LOC~~: POSITIVE — AB
MDMA (Ecstasy)Ur Screen: NOT DETECTED
Methadone Scn, Ur: NOT DETECTED
Opiate, Ur Screen: NOT DETECTED
Phencyclidine (PCP) Ur S: NOT DETECTED
Tricyclic, Ur Screen: NOT DETECTED

## 2022-08-26 LAB — CBC WITH DIFFERENTIAL/PLATELET
Abs Immature Granulocytes: 0.01 10*3/uL (ref 0.00–0.07)
Basophils Absolute: 0.1 10*3/uL (ref 0.0–0.1)
Basophils Relative: 1 %
Eosinophils Absolute: 0.1 10*3/uL (ref 0.0–0.5)
Eosinophils Relative: 2 %
HCT: 41.9 % (ref 39.0–52.0)
Hemoglobin: 13.2 g/dL (ref 13.0–17.0)
Immature Granulocytes: 0 %
Lymphocytes Relative: 32 %
Lymphs Abs: 1.9 10*3/uL (ref 0.7–4.0)
MCH: 26.9 pg (ref 26.0–34.0)
MCHC: 31.5 g/dL (ref 30.0–36.0)
MCV: 85.3 fL (ref 80.0–100.0)
Monocytes Absolute: 0.9 10*3/uL (ref 0.1–1.0)
Monocytes Relative: 15 %
Neutro Abs: 3 10*3/uL (ref 1.7–7.7)
Neutrophils Relative %: 50 %
Platelets: 219 10*3/uL (ref 150–400)
RBC: 4.91 MIL/uL (ref 4.22–5.81)
RDW: 15.2 % (ref 11.5–15.5)
WBC: 6 10*3/uL (ref 4.0–10.5)
nRBC: 0 % (ref 0.0–0.2)

## 2022-08-26 LAB — ACETAMINOPHEN LEVEL: Acetaminophen (Tylenol), Serum: <10 ug/mL — ABNORMAL LOW (ref 10–30)

## 2022-08-26 LAB — CBC
HCT: 42.3 % (ref 39.0–52.0)
Hemoglobin: 13.1 g/dL (ref 13.0–17.0)
MCH: 26.4 pg (ref 26.0–34.0)
MCHC: 31 g/dL (ref 30.0–36.0)
MCV: 85.3 fL (ref 80.0–100.0)
Platelets: 222 10*3/uL (ref 150–400)
RBC: 4.96 MIL/uL (ref 4.22–5.81)
RDW: 15.3 % (ref 11.5–15.5)
WBC: 6.4 10*3/uL (ref 4.0–10.5)
nRBC: 0 % (ref 0.0–0.2)

## 2022-08-26 LAB — SALICYLATE LEVEL: Salicylate Lvl: <7 mg/dL — ABNORMAL LOW (ref 7.0–30.0)

## 2022-08-26 LAB — LIPASE, BLOOD: Lipase: 26 U/L (ref 11–51)

## 2022-08-26 LAB — ETHANOL: Alcohol, Ethyl (B): <10 mg/dL (ref <10)

## 2022-08-26 MED ORDER — HYDROXYZINE HCL 25 MG PO TABS: 25.0000 mg | ORAL_TABLET | Freq: Three times a day (TID) | ORAL | Status: DC | PRN

## 2022-08-26 MED ORDER — ONDANSETRON 4 MG PO TBDP: 4.0000 mg | ORAL_TABLET | Freq: Three times a day (TID) | ORAL | Status: DC | PRN

## 2022-08-26 MED ORDER — MIRTAZAPINE 15 MG PO TBDP
7.5000 mg | ORAL_TABLET | Freq: Every day | ORAL | Status: DC
  Administered 2022-08-26: 7.5 mg via ORAL
  Filled 2022-08-26: qty 0.5

## 2022-08-26 MED ORDER — APIXABAN 5 MG PO TABS
5.0000 mg | ORAL_TABLET | Freq: Two times a day (BID) | ORAL | Status: DC
  Administered 2022-08-26 – 2022-08-27 (×3): 5 mg via ORAL
  Filled 2022-08-26 (×3): qty 1

## 2022-08-26 NOTE — BH Assessment (Signed)
Detox  Referral information for detox treatment faxed to:   [facility] [phone] or [phone])   [facility] [phone])   [facility] [phone])   [facility] [phone])  . [place] [phone])   [facility] [phone])  . [place] [phone])

## 2022-08-26 NOTE — ED Notes (Signed)
Pt given ice water per his request  
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
## 2022-08-26 NOTE — ED Notes (Signed)
Pt transported to XR.  

## 2022-08-26 NOTE — ED Notes (Signed)
Attempted to wake pt to give dinner tray. Pt sleeping heavily at this time.

## 2022-08-26 NOTE — BH Assessment (Signed)
Received call from [facility] where patient referral is being reviewed. Rep will call back if patient is appropriate for admission.

## 2022-08-26 NOTE — BH Assessment (Signed)
Comprehensive Clinical Assessment (CCA) Screening, Triage and Referral Note  08/26/2022 Cameron Prince [id number]  Cameron Prince, 51 year old male who presents to [facility] ED voluntarily for treatment. Per triage note, Pt reports SI without plan. Reports intermittent attempt. Reports hx of same and requesting psychiatric evaluation. Pt reports taken off of psychiatric medications d/t concern for interaction with prescribed blood thinners. Pt calm and cooperative at this time. Pt reports nausea with 2 episodes of emesis today. Denies abd pain or diarrhea.   During TTS assessment pt presents alert and oriented x 4, restless but cooperative, and mood-congruent with affect. The pt does not appear to be responding to internal or external stimuli. Neither is the pt presenting with any delusional thinking. Pt verified the information provided to triage RN.   Pt identifies his main complaint to be that he is having suicidal thoughts. Patient reports the thoughts have worsened over the past couple of weeks and are due to poly substance use for 10+ years. Patient reports no prior attempts to harm himself nor any prior history of rehab. Patient states he is not working, and he is homeless. Patient reports he does not have any family within the area. Patient states he was released from prison March 14, 2022 and while incarcerated he was given Paxil and Remeron for depression. Patient was seen at [facility] last month for medical related issues and was taken off psych meds due to possible drug interactions with his Eliquis. Pt denies current SI/HI/AH/VH but would like assistance with substance use. Writer will refer patient out for rehab.    Per Sallye Ober, NP, pt does not meet criteria for inpatient psychiatric admission.   Chief Complaint:  Chief Complaint  Patient presents with   Nausea   Suicidal   Visit Diagnosis: Cocaine use disorder, moderate, dependence  Patient Reported Information How did you hear about [place]?  Self  What Is the Reason for Your Visit/Call Today? Patient reports having suicidal thoughts due to poly substance use.  How Long Has This Been Causing You Problems? 1 wk - 1 month  What Do You Feel Would Help You the Most Today? Alcohol or Drug Use Treatment   Have You Recently Had Any Thoughts About Hurting Yourself? Yes  Are You Planning to Commit Suicide/Harm Yourself At This time? No   Have you Recently Had Thoughts About Hurting Someone Cameron Prince? No  Are You Planning to Harm Someone at This Time? No  Explanation: No data recorded  Have You Used Any Alcohol or Drugs in the Past 24 Hours? Yes  How Long Ago Did You Use Drugs or Alcohol? No data recorded What Did You Use and How Much? Cocaine and marijuana   Do You Currently Have a Therapist/Psychiatrist? No  Name of Therapist/Psychiatrist: No data recorded  Have You Been Recently Discharged From Any Office Practice or Programs? No  Explanation of Discharge From Practice/Program: No data recorded   CCA Screening Triage Referral Assessment Type of Contact: Face-to-Face  Telemedicine Service Delivery:   Is this Initial or Reassessment?   Date Telepsych consult ordered in [facility]:    Time Telepsych consult ordered in [facility]:    Location of Assessment: [facility] ED  Provider Location: [facility] ED    Collateral Involvement: None provided   Does Patient Have a Court Appointed Legal Guardian? No data recorded Name and Contact of Legal Guardian: No data recorded If Minor and Not Living with Parent(s), Who has Custody? No data recorded Is CPS involved or ever been involved? No  data recorded Is APS involved or ever been involved? No data recorded  Patient Determined To Be At Risk for Harm To Self or Others Based on Review of Patient Reported Information or Presenting Complaint? No data recorded Method: No data recorded Availability of Means: No data recorded Intent: No data recorded Notification Required: No data recorded Additional  Information for Danger to Others Potential: No data recorded Additional Comments for Danger to Others Potential: No data recorded Are There Guns or Other Weapons in Your Home? No data recorded Types of Guns/Weapons: No data recorded Are These Weapons Safely Secured?                            No data recorded Who Could Verify You Are Able To Have These Secured: No data recorded Do You Have any Outstanding Charges, Pending Court Dates, Parole/Probation? No data recorded Contacted To Inform of Risk of Harm To Self or Others: No data recorded  Does Patient Present under Involuntary Commitment? No    [place] of Residence: Cameron Prince   Patient Currently Receiving the Following Services: Not Receiving Services   Determination of Need: Emergent (2 hours)   Options For Referral: ED Visit; Chemical Dependency Intensive Outpatient Therapy (CDIOP); Medication Management   Discharge Disposition:     Clerance Lav, Counselor, LCAS-A

## 2022-08-26 NOTE — ED Notes (Signed)
EDP at bedside  

## 2022-08-26 NOTE — Consult Note (Signed)
ANTICOAGULATION CONSULT NOTE  Pharmacy Consult for Eliquis Indication: DVT  Allergies  Allergen Reactions   Lisinopril Cough    Patient Measurements: Height: 6\' 4"  (193 cm) Weight: 122.9 kg (271 lb) IBW/kg (Calculated) : 86.8  Vital Signs: Temp: 97.8 F (36.6 C) (11/22 1040) Temp Source: Oral (11/22 1040) BP: 136/82 (11/22 1040) Pulse Rate: 60 (11/22 1040)  Labs: Recent Labs    08/26/22 0219 08/26/22 0514  HGB 13.2  13.1  --   HCT 41.9  42.3  --   PLT 219  222  --   CREATININE 0.95  --   TROPONINIHS 10 8    Estimated Creatinine Clearance: 131.7 mL/min (by C-G formula based on SCr of 0.95 mg/dL).   Medical History: Past Medical History:  Diagnosis Date   Depression    Hypertension    PTSD (post-traumatic stress disorder)     Medications:  Patient on Eliquis PTA for Hx of DVT.  No dose taken today.  Assessment: 51 yo male presenting to the ED with suicidal ideation.  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  Eliquis initially started on 07/31/22.  Goal of Therapy:  Monitor platelets by anticoagulation protocol: Yes   Plan:  Resume home dose of Eliquis 5 mg po BID. Check CBC at least every three days   08/02/22, PharmD 08/26/2022,1:08 PM

## 2022-08-26 NOTE — ED Triage Notes (Signed)
Pt reports SI without plan. Reports intermittent attempt. Reports hx of same and requesting psychiatric evaluation. Pt reports taken off of psychiatric medications d/t concern for interaction with prescribed blood thinners. Pt calm and cooperative at this time.   Pt reports nausea with 2 episodes emesis today. Denies abd pain or diarrhea.

## 2022-08-26 NOTE — Consult Note (Signed)
[facility] Face-to-Face Psychiatry Consult   Reason for Consult: Depression/substance use Referring Physician:  EDP Patient Identification: Cameron Prince MRN:  [id number] Principal Diagnosis: Cocaine use disorder, moderate, dependence ([place]) Diagnosis:  Principal Problem:   Cocaine use disorder, moderate, dependence ([place]) Active Problems:   Depression   Total Time spent with patient: 45 minutes  Subjective: " I want to stop using drugs." Cameron Prince is a 51 y.o. male patient admitted with depression/substance use.  HPI: Patient presents to the ED voluntarily, endorsing depression and wanting help with substance abuse treatment.  On evaluation, patient has depressed affect, but is calm, cooperative, alert and oriented x 4.  He specifically states that he is depressed, with desire to stop using drugs.  He states that his depression mostly stems from his drug use.  When asked what kind of illicit substances he uses he states "whenever I can get my hands on."  UDS is positive for cocaine and cannabinoids.  Patient currently denies any suicidal thought or intent.  Patient denies homicidal ideation.  Denies auditory or visual hallucinations or paranoia.  He is speaking in clear, coherent sentences.  Patient denies any prior suicide attempts or psychiatric hospitalizations.  He states that he was incarcerated for several years and was released in June 2023.  During the incarceration he was put on Paxil and Remeron for some depressive symptoms.  He states that it helped "a little."  Patient was in this hospital last month for significant DVT and was placed on Eliquis.  At that time, the medical team elected to stop the Remeron and Paxil due to possible drug-drug interactions/bleeding, according to patient.  Chart review reveals that the Eliquis and Remeron were stopped by the medical team.  Writer spoke with Dr. Weber Cooks regarding restarting Remeron, as there does not seem to be a contraindication with patient's  physical diagnoses/DVT.  Dr. Weber Cooks agrees that we can restart Remeron.   We will refer patient out for substance use, as requested.    Past Psychiatric History: Depression, substance abuse.  Denies any prior suicide attempts or psychiatric hospitalizations.  Risk to Self:   Risk to Others:   Prior Inpatient Therapy:   Prior Outpatient Therapy:    Past Medical History:  Past Medical History:  Diagnosis Date   Depression    Hypertension    PTSD (post-traumatic stress disorder)     Past Surgical History:  Procedure Laterality Date   ABDOMINAL SURGERY     PERIPHERAL VASCULAR THROMBECTOMY Right 07/29/2022   Procedure: PERIPHERAL VASCULAR THROMBECTOMY;  Surgeon: Katha Cabal, MD;  Location: [facility];  Service: Cardiovascular;  Laterality: Right;   Family History:  Family History  Problem Relation Age of Onset   Heart disease Mother    Diabetes Father    Family Psychiatric  History: Denies Social History:  Social History   Substance and Sexual Activity  Alcohol Use Yes   Comment: occasionally     Social History   Substance and Sexual Activity  Drug Use Yes   Types: Marijuana    Social History   Socioeconomic History   Marital status: Legally Separated    Spouse name: Not on file   Number of children: Not on file   Years of education: Not on file   Highest education level: Not on file  Occupational History   Not on file  Tobacco Use   Smoking status: Every Day    Types: Cigarettes   Smokeless tobacco: Never  Substance and Sexual Activity  Alcohol use: Yes    Comment: occasionally   Drug use: Yes    Types: Marijuana   Sexual activity: Not on file  Other Topics Concern   Not on file  Social History Narrative   Not on file   Social Determinants of Health   Financial Resource Strain: Not on file  Food Insecurity: Not on file  Transportation Needs: Not on file  Physical Activity: Not on file  Stress: Not on file  Social  Connections: Not on file   Additional Social History:    Allergies:   Allergies  Allergen Reactions   Lisinopril Cough    Labs:  Results for orders placed or performed during the hospital encounter of 08/26/22 (from the past 48 hour(s))  Comprehensive metabolic panel     Status: Abnormal   Collection Time: 08/26/22  2:19 AM  Result Value Ref Range   Sodium 138 135 - 145 mmol/L   Potassium 3.4 (L) 3.5 - 5.1 mmol/L   Chloride 106 98 - 111 mmol/L   CO2 23 22 - 32 mmol/L   Glucose, Bld 182 (H) 70 - 99 mg/dL    Comment: Glucose reference range applies only to samples taken after fasting for at least 8 hours.   BUN 14 6 - 20 mg/dL   Creatinine, Ser 4.09 0.61 - 1.24 mg/dL   Calcium 9.2 8.9 - 81.1 mg/dL   Total Protein 7.5 6.5 - 8.1 g/dL   Albumin 3.5 3.5 - 5.0 g/dL   AST 26 15 - 41 U/L   ALT 19 0 - 44 U/L   Alkaline Phosphatase 47 38 - 126 U/L   Total Bilirubin 1.8 (H) 0.3 - 1.2 mg/dL   GFR, Estimated >91 >47 mL/min    Comment: (NOTE) Calculated using the CKD-EPI Creatinine Equation (2021)    Anion gap 9 5 - 15    Comment: Performed at [facility], [address] [place], [place] [address]  Ethanol     Status: None   Collection Time: 08/26/22  2:19 AM  Result Value Ref Range   Alcohol, Ethyl (B) <10 <10 mg/dL    Comment: (NOTE) Lowest detectable limit for serum alcohol is 10 mg/dL.  For medical purposes only. Performed at [facility], [address] [place], [place] [address]   Salicylate level     Status: Abnormal   Collection Time: 08/26/22  2:19 AM  Result Value Ref Range   Salicylate Lvl <7.0 (L) 7.0 - 30.0 mg/dL    Comment: Performed at [facility], [address] [place], [place] [address]  Acetaminophen level     Status: Abnormal   Collection Time: 08/26/22  2:19 AM  Result Value Ref Range   Acetaminophen (Tylenol), Serum <10 (L) 10 - 30 ug/mL    Comment: (NOTE) Therapeutic concentrations vary significantly. A range of  10-30 ug/mL  may be an effective concentration for many patients. However, some  are best treated at concentrations outside of this range. Acetaminophen concentrations >150 ug/mL at 4 hours after ingestion  and >50 ug/mL at 12 hours after ingestion are often associated with  toxic reactions.  Performed at [facility], [address] [place], [place] [address]   cbc     Status: None   Collection Time: 08/26/22  2:19 AM  Result Value Ref Range   WBC 6.4 4.0 - 10.5 K/uL   RBC 4.96 4.22 - 5.81 MIL/uL   Hemoglobin 13.1 13.0 - 17.0 g/dL   HCT 42.3  39.0 - 52.0 %   MCV 85.3 80.0 - 100.0 fL   MCH 26.4 26.0 - 34.0 pg   MCHC 31.0 30.0 - 36.0 g/dL   RDW 15.3 11.5 - 15.5 %   Platelets 222 150 - 400 K/uL   nRBC 0.0 0.0 - 0.2 %    Comment: Performed at [facility], [address] [place], [place] [address]  Lipase, blood     Status: None   Collection Time: 08/26/22  2:19 AM  Result Value Ref Range   Lipase 26 11 - 51 U/L    Comment: Performed at [facility], [place], [place] [address]  Troponin I (High Sensitivity)     Status: None   Collection Time: 08/26/22  2:19 AM  Result Value Ref Range   Troponin I (High Sensitivity) 10 <18 ng/L    Comment: (NOTE) Elevated high sensitivity troponin I (hsTnI) values and significant  changes across serial measurements may suggest ACS but many other  chronic and acute conditions are known to elevate hsTnI results.  Refer to the "Links" section for chest pain algorithms and additional  guidance. Performed at [facility], [address] [place], [place] [address]   CBC with Differential/Platelet     Status: None   Collection Time: 08/26/22  2:19 AM  Result Value Ref Range   WBC 6.0 4.0 - 10.5 K/uL   RBC 4.91 4.22 - 5.81 MIL/uL   Hemoglobin 13.2 13.0 - 17.0 g/dL   HCT 41.9 39.0 - 52.0 %   MCV 85.3 80.0 - 100.0 fL   MCH 26.9 26.0 - 34.0 pg   MCHC 31.5 30.0 - 36.0 g/dL   RDW 15.2 11.5 -  15.5 %   Platelets 219 150 - 400 K/uL   nRBC 0.0 0.0 - 0.2 %   Neutrophils Relative % 50 %   Neutro Abs 3.0 1.7 - 7.7 K/uL   Lymphocytes Relative 32 %   Lymphs Abs 1.9 0.7 - 4.0 K/uL   Monocytes Relative 15 %   Monocytes Absolute 0.9 0.1 - 1.0 K/uL   Eosinophils Relative 2 %   Eosinophils Absolute 0.1 0.0 - 0.5 K/uL   Basophils Relative 1 %   Basophils Absolute 0.1 0.0 - 0.1 K/uL   Immature Granulocytes 0 %   Abs Immature Granulocytes 0.01 0.00 - 0.07 K/uL    Comment: Performed at [facility], [place], [place] [address]  Troponin I (High Sensitivity)     Status: None   Collection Time: 08/26/22  5:14 AM  Result Value Ref Range   Troponin I (High Sensitivity) 8 <18 ng/L    Comment: (NOTE) Elevated high sensitivity troponin I (hsTnI) values and significant  changes across serial measurements may suggest ACS but many other  chronic and acute conditions are known to elevate hsTnI results.  Refer to the "Links" section for chest pain algorithms and additional  guidance. Performed at [facility], [address] [place], [place] [address]   Urine Drug Screen, Qualitative     Status: Abnormal   Collection Time: 08/26/22  9:00 AM  Result Value Ref Range   Tricyclic, Ur Screen NONE DETECTED NONE DETECTED   Amphetamines, Ur Screen NONE DETECTED NONE DETECTED   MDMA (Ecstasy)Ur Screen NONE DETECTED NONE DETECTED   Cocaine Metabolite,Ur  POSITIVE (A) NONE DETECTED   Opiate, Ur Screen NONE DETECTED NONE DETECTED   Phencyclidine (PCP) Ur S NONE DETECTED NONE DETECTED   Cannabinoid  5 Ng, Ur [place] POSITIVE (A) NONE DETECTED   Barbiturates, Ur Screen NONE DETECTED NONE DETECTED   Benzodiazepine, Ur Scrn NONE DETECTED NONE DETECTED   Methadone Scn, Ur NONE DETECTED NONE DETECTED    Comment: (NOTE) Tricyclics + metabolites, urine    Cutoff 1000 ng/mL Amphetamines + metabolites, urine  Cutoff 1000 ng/mL MDMA (Ecstasy), urine              Cutoff 500  ng/mL Cocaine Metabolite, urine          Cutoff 300 ng/mL Opiate + metabolites, urine        Cutoff 300 ng/mL Phencyclidine (PCP), urine         Cutoff 25 ng/mL Cannabinoid, urine                 Cutoff 50 ng/mL Barbiturates + metabolites, urine  Cutoff 200 ng/mL Benzodiazepine, urine              Cutoff 200 ng/mL Methadone, urine                   Cutoff 300 ng/mL  The urine drug screen provides only a preliminary, unconfirmed analytical test result and should not be used for non-medical purposes. Clinical consideration and professional judgment should be applied to any positive drug screen result due to possible interfering substances. A more specific alternate chemical method must be used in order to obtain a confirmed analytical result. Gas chromatography / mass spectrometry (GC/MS) is the preferred confirm atory method. Performed at [facility], [address] [place], [place] [address]   Urinalysis, Routine w reflex microscopic Urine, Random     Status: Abnormal   Collection Time: 08/26/22  9:00 AM  Result Value Ref Range   Color, Urine AMBER (A) YELLOW    Comment: BIOCHEMICALS MAY BE AFFECTED BY COLOR   APPearance HAZY (A) CLEAR   Specific Gravity, Urine 1.033 (H) 1.005 - 1.030   pH 5.0 5.0 - 8.0   Glucose, UA NEGATIVE NEGATIVE mg/dL   Hgb urine dipstick NEGATIVE NEGATIVE   Bilirubin Urine NEGATIVE NEGATIVE   Ketones, ur 20 (A) NEGATIVE mg/dL   Protein, ur 30 (A) NEGATIVE mg/dL   Nitrite NEGATIVE NEGATIVE   Leukocytes,Ua NEGATIVE NEGATIVE   RBC / HPF 0-5 0 - 5 RBC/hpf   WBC, UA 11-20 0 - 5 WBC/hpf   Bacteria, UA MANY (A) NONE SEEN   Squamous Epithelial / LPF NONE SEEN 0 - 5   Mucus PRESENT    Hyaline Casts, UA PRESENT     Comment: Performed at [facility], [address] [place],  [address]    Current Facility-Administered Medications  Medication Dose Route Frequency Provider Last Rate Last Admin   apixaban (ELIQUIS) tablet 5 mg  5  mg Oral BID Lorin Picket, RPH   5 mg at 08/26/22 1314   hydrOXYzine (ATARAX) tablet 25 mg  25 mg Oral TID PRN Sherlon Handing, NP       mirtazapine (REMERON SOL-TAB) disintegrating tablet 7.5 mg  7.5 mg Oral QHS Tramon Crescenzo F, NP       ondansetron (ZOFRAN-ODT) disintegrating tablet 4 mg  4 mg Oral Q8H PRN Nena Polio, MD       Current Outpatient Medications  Medication Sig Dispense Refill   apixaban (ELIQUIS) 5 MG TABS tablet Advised to take Eliquis 10 mg twice daily for 7 days followed by 5 mg twice daily for next 6 to 8 months. 60 tablet  3   apixaban (ELIQUIS) 5 MG TABS tablet Take 2 tablets (10mg ) twice daily for 7 days, then 1 tablet (5mg ) twice daily (Patient not taking: Reported on 08/26/2022) 60 tablet 3   benazepril-hydrochlorthiazide (LOTENSIN HCT) 5-6.25 MG tablet Take 1 tablet by mouth daily. Pt. Does not know mg/dose (Patient not taking: Reported on 08/26/2022)     enalapril (VASOTEC) 5 MG tablet Take 1 tablet (5 mg total) by mouth daily. 30 tablet 3   metFORMIN (GLUCOPHAGE) 500 MG tablet Take 1 tablet (500 mg total) by mouth 2 (two) times daily with a meal. 60 tablet 3    Musculoskeletal: Strength & Muscle Tone: within normal limits Gait & Station: normal Patient leans: N/A    Psychiatric Specialty Exam:  Presentation  General Appearance: Appropriate for Environment  Eye Contact:Good  Speech:Clear and Coherent  Speech Volume:Normal  Handedness:No data recorded  Mood and Affect  Mood:Depressed  Affect:Blunt   Thought Process  Thought Processes:Coherent  Descriptions of Associations:Intact  Orientation:Full (Time, Place and Person)  Thought Content:WDL  History of Schizophrenia/Schizoaffective disorder:No data recorded Duration of Psychotic Symptoms:No data recorded Hallucinations:Hallucinations: None  Ideas of Reference:None  Suicidal Thoughts:Suicidal Thoughts: No  Homicidal Thoughts:Homicidal Thoughts: No   Sensorium   Memory:Recent Good  Judgment:Poor  Insight:Fair   Executive Functions  Concentration:Fair  Attention Span:Fair  [place]   Psychomotor Activity  Psychomotor Activity:Psychomotor Activity: Normal   Assets  Assets:Desire for Improvement; Resilience   Sleep  Sleep:Sleep: Fair   Physical Exam: Physical Exam Vitals and nursing note reviewed.  HENT:     Head: Normocephalic.     Nose: No congestion or rhinorrhea.  Eyes:     General:        Right eye: No discharge.        Left eye: No discharge.  Cardiovascular:     Rate and Rhythm: Normal rate.  Pulmonary:     Effort: Pulmonary effort is normal.  Musculoskeletal:        General: Normal range of motion.     Cervical back: Normal range of motion.  Skin:    General: Skin is dry.  Neurological:     Mental Status: He is alert and oriented to person, place, and time.  Psychiatric:        Attention and Perception: Attention normal.        Mood and Affect: Mood normal.        Speech: Speech normal.        Behavior: Behavior normal.        Thought Content: Thought content normal. Thought content is not paranoid or delusional. Thought content does not include homicidal or suicidal ideation.        Cognition and Memory: Cognition normal.        Judgment: Judgment normal.    Review of Systems  Constitutional: Negative.   HENT: Negative.    Eyes: Negative.   Respiratory: Negative.    Musculoskeletal: Negative.   Skin: Negative.   Psychiatric/Behavioral:  Positive for depression (stable) and substance abuse. Negative for hallucinations, memory loss and suicidal ideas. The patient is not nervous/anxious and does not have insomnia.    Blood pressure 136/82, pulse 60, temperature 97.8 F (36.6 C), temperature source Oral, resp. rate 18, height 6\' 4"  (1.93 m), weight 122.9 kg, SpO2 94 %. Body mass index is 32.99 kg/m.  Treatment Plan Summary: Plan Refer patient out for  substance use treatment/depression. Restarted Remeron, 7.5 mg nightly. Hydroxyzine 25  mg 3 times daily for anxiety. Reviewed with Dr. Jacqualine Code   Disposition: No evidence of imminent risk to self or others at present.   Patient does not meet criteria for psychiatric inpatient admission. Supportive therapy provided about ongoing stressors. Discussed crisis plan, support from social network, calling 911, coming to the Emergency Department, and calling Suicide Hotline.  Sherlon Handing, NP 08/26/2022 1:15 PM

## 2022-08-26 NOTE — ED Notes (Signed)
Pt currently sleeping in hallway recliner. Awaiting EDP evaluation. Has been dressed out in hospital scrubs and belonging removed prior to this RN's arrival.

## 2022-08-26 NOTE — ED Triage Notes (Signed)
Pt now clarified and states he has a plan for how he would kill himself and is unsure if he would carry it out. Pt not forthcoming with plan at this time. Pt maintains he would like assistance with SI.

## 2022-08-26 NOTE — BH Assessment (Signed)
Referral check:    [facility] [phone] or [phone])    [facility] [phone])    [facility] [phone])    [facility] [phone]) Denied due to insurance   . [facility] [phone]) Denied    [facility] [phone]) Nurse is currently busy, left phone number for a return phone call   . [facility] [phone])  Added facility:   [name] [phone] -or- [phone]),

## 2022-08-26 NOTE — BH Assessment (Signed)
Patient was declined by [facility] due to inappropriate for services. They do not offer detox for cocaine. [facility] declined patient as he has [employer].

## 2022-08-26 NOTE — ED Notes (Signed)
Pt is asleep will obtain vitals when pt wakes up.

## 2022-08-26 NOTE — ED Notes (Signed)
Pt awoke. This tech obtained vital signs and gave pt a snacks. No other needs at this moment.

## 2022-08-26 NOTE — ED Notes (Signed)
Patient ambulatory to bathroom; labeled specimen cup provided and asked to give urine sample.  Patient agrees.

## 2022-08-26 NOTE — ED Notes (Signed)
1 pair black shoes 1 pair socks 1 pair jeans  1 black belt 2 blue lighters 1 pair underwear 1 black t-shirt 1 black jacket 1 cellphone 1 wallet with cards 1 bottle Eliquis  All items placed in belongings bag, labeled with pt labels

## 2022-08-26 NOTE — ED Provider Notes (Signed)
The patient has been placed in psychiatric observation due to the need to provide a safe environment for the patient while obtaining psychiatric consultation and evaluation, as well as ongoing medical and medication management to treat the patient's condition.    Psychiatry continuing to follow the patient and nurse likely going to start him on Remeron.  I was able to review his notes, he has a recent DVT and is to be on Eliquis which I have ordered via pharmacy    Sharyn Creamer, MD 08/26/22 1254

## 2022-08-27 MED ORDER — FOSFOMYCIN TROMETHAMINE 3 G PO PACK
3.0000 g | PACK | Freq: Once | ORAL | Status: AC
  Administered 2022-08-27: 3 g via ORAL
  Filled 2022-08-27: qty 3

## 2022-08-27 NOTE — ED Notes (Signed)
E-signature not working at this time. Pt verbalized understanding of D/C instructions, prescriptions and follow up care with no further questions at this time. Pt in NAD and ambulatory at time of D/C.  Belongings bag 1/1 returned to patient at time of discharge.

## 2022-08-27 NOTE — ED Notes (Signed)
Pt belonging bags 1 & 2 of 2 placed in locked storage unit in the BHU and placed in bin appropriately labeled with Rm 24H by this RN.

## 2022-08-27 NOTE — ED Notes (Signed)
[facility] waiver signed by patient and placed in medical records

## 2022-08-27 NOTE — Discharge Instructions (Signed)
Cleared by psych for discharge home.

## 2022-08-27 NOTE — Consult Note (Signed)
[facility] Face-to-Face Psychiatry Consult   Reason for Consult: Depression/substance use Referring Physician:  EDP Patient Identification: Cameron Prince MRN:  [id number] Principal Diagnosis: Cocaine use disorder, moderate, dependence ([place]) Diagnosis:  Principal Problem:   Cocaine use disorder, moderate, dependence ([place]) Active Problems:   Depression   Total Time spent with patient: 45 minutes  Subjective: " I'm alright." Cameron Prince is a 51 y.o. male patient admitted with depression/substance use.  HPI: Patient presents to the ED voluntarily, endorsing depression and wanting help with substance abuse treatment.  Denies suicidal ideations, homicidal ideations, hallucinations, and withdrawal symptoms.  He does want rehab, TTS will seek these services and resources, psych cleared.  Per TTS, Waldon Merl on admission to the ED: On evaluation, patient has depressed affect, but is calm, cooperative, alert and oriented x 4.  He specifically states that he is depressed, with desire to stop using drugs.  He states that his depression mostly stems from his drug use.  When asked what kind of illicit substances he uses he states "whenever I can get my hands on."  UDS is positive for cocaine and cannabinoids.  Patient currently denies any suicidal thought or intent.  Patient denies homicidal ideation.  Denies auditory or visual hallucinations or paranoia.  He is speaking in clear, coherent sentences.  Patient denies any prior suicide attempts or psychiatric hospitalizations.  He states that he was incarcerated for several years and was released in June 2023.  During the incarceration he was put on Paxil and Remeron for some depressive symptoms.  He states that it helped "a little."  Patient was in this hospital last month for significant DVT and was placed on Eliquis.  At that time, the medical team elected to stop the Remeron and Paxil due to possible drug-drug interactions/bleeding, according to patient.   Chart review reveals that the Eliquis and Remeron were stopped by the medical team.  Writer spoke with Dr. Weber Cooks regarding restarting Remeron, as there does not seem to be a contraindication with patient's physical diagnoses/DVT.  Dr. Weber Cooks agrees that we can restart Remeron.  We will refer patient out for substance use, as requested.    Past Psychiatric History: Depression, substance abuse.  Denies any prior suicide attempts or psychiatric hospitalizations.  Risk to Self:  none Risk to Others:  none Prior Inpatient Therapy:  rehabs Prior Outpatient Therapy:  none  Past Medical History:  Past Medical History:  Diagnosis Date   Depression    Hypertension    PTSD (post-traumatic stress disorder)     Past Surgical History:  Procedure Laterality Date   ABDOMINAL SURGERY     PERIPHERAL VASCULAR THROMBECTOMY Right 07/29/2022   Procedure: PERIPHERAL VASCULAR THROMBECTOMY;  Surgeon: Katha Cabal, MD;  Location: [facility];  Service: Cardiovascular;  Laterality: Right;   Family History:  Family History  Problem Relation Age of Onset   Heart disease Mother    Diabetes Father    Family Psychiatric  History: Denies Social History:  Social History   Substance and Sexual Activity  Alcohol Use Yes   Comment: occasionally     Social History   Substance and Sexual Activity  Drug Use Yes   Types: Marijuana    Social History   Socioeconomic History   Marital status: Legally Separated    Spouse name: Not on file   Number of children: Not on file   Years of education: Not on file   Highest education level: Not on file  Occupational History  Not on file  Tobacco Use   Smoking status: Every Day    Types: Cigarettes   Smokeless tobacco: Never  Substance and Sexual Activity   Alcohol use: Yes    Comment: occasionally   Drug use: Yes    Types: Marijuana   Sexual activity: Not on file  Other Topics Concern   Not on file  Social History Narrative   Not on  file   Social Determinants of Health   Financial Resource Strain: Not on file  Food Insecurity: Not on file  Transportation Needs: Not on file  Physical Activity: Not on file  Stress: Not on file  Social Connections: Not on file   Additional Social History:    Allergies:   Allergies  Allergen Reactions   Lisinopril Cough    Labs:  Results for orders placed or performed during the hospital encounter of 08/26/22 (from the past 48 hour(s))  Comprehensive metabolic panel     Status: Abnormal   Collection Time: 08/26/22  2:19 AM  Result Value Ref Range   Sodium 138 135 - 145 mmol/L   Potassium 3.4 (L) 3.5 - 5.1 mmol/L   Chloride 106 98 - 111 mmol/L   CO2 23 22 - 32 mmol/L   Glucose, Bld 182 (H) 70 - 99 mg/dL    Comment: Glucose reference range applies only to samples taken after fasting for at least 8 hours.   BUN 14 6 - 20 mg/dL   Creatinine, Ser 4.09 0.61 - 1.24 mg/dL   Calcium 9.2 8.9 - 81.1 mg/dL   Total Protein 7.5 6.5 - 8.1 g/dL   Albumin 3.5 3.5 - 5.0 g/dL   AST 26 15 - 41 U/L   ALT 19 0 - 44 U/L   Alkaline Phosphatase 47 38 - 126 U/L   Total Bilirubin 1.8 (H) 0.3 - 1.2 mg/dL   GFR, Estimated >91 >47 mL/min    Comment: (NOTE) Calculated using the CKD-EPI Creatinine Equation (2021)    Anion gap 9 5 - 15    Comment: Performed at [facility], [address] [place], [place] [address]  Ethanol     Status: None   Collection Time: 08/26/22  2:19 AM  Result Value Ref Range   Alcohol, Ethyl (B) <10 <10 mg/dL    Comment: (NOTE) Lowest detectable limit for serum alcohol is 10 mg/dL.  For medical purposes only. Performed at [facility], [address] [place], [place] [address]   Salicylate level     Status: Abnormal   Collection Time: 08/26/22  2:19 AM  Result Value Ref Range   Salicylate Lvl <7.0 (L) 7.0 - 30.0 mg/dL    Comment: Performed at [facility], [address] [place], [place] [address]  Acetaminophen level      Status: Abnormal   Collection Time: 08/26/22  2:19 AM  Result Value Ref Range   Acetaminophen (Tylenol), Serum <10 (L) 10 - 30 ug/mL    Comment: (NOTE) Therapeutic concentrations vary significantly. A range of 10-30 ug/mL  may be an effective concentration for many patients. However, some  are best treated at concentrations outside of this range. Acetaminophen concentrations >150 ug/mL at 4 hours after ingestion  and >50 ug/mL at 12 hours after ingestion are often associated with  toxic reactions.  Performed at [facility], [address] [place], [place] [address]   cbc     Status: None   Collection Time: 08/26/22  2:19 AM  Result Value Ref  Range   WBC 6.4 4.0 - 10.5 K/uL   RBC 4.96 4.22 - 5.81 MIL/uL   Hemoglobin 13.1 13.0 - 17.0 g/dL   HCT 42.3 39.0 - 52.0 %   MCV 85.3 80.0 - 100.0 fL   MCH 26.4 26.0 - 34.0 pg   MCHC 31.0 30.0 - 36.0 g/dL   RDW 15.3 11.5 - 15.5 %   Platelets 222 150 - 400 K/uL   nRBC 0.0 0.0 - 0.2 %    Comment: Performed at [facility], [address] [place], [place] [address]  Lipase, blood     Status: None   Collection Time: 08/26/22  2:19 AM  Result Value Ref Range   Lipase 26 11 - 51 U/L    Comment: Performed at [facility], [address] [place], [place] [address]  Troponin I (High Sensitivity)     Status: None   Collection Time: 08/26/22  2:19 AM  Result Value Ref Range   Troponin I (High Sensitivity) 10 <18 ng/L    Comment: (NOTE) Elevated high sensitivity troponin I (hsTnI) values and significant  changes across serial measurements may suggest ACS but many other  chronic and acute conditions are known to elevate hsTnI results.  Refer to the "Links" section for chest pain algorithms and additional  guidance. Performed at [facility], [address] [place], [place] [address]   CBC with Differential/Platelet     Status: None   Collection Time: 08/26/22  2:19 AM  Result Value Ref Range   WBC  6.0 4.0 - 10.5 K/uL   RBC 4.91 4.22 - 5.81 MIL/uL   Hemoglobin 13.2 13.0 - 17.0 g/dL   HCT 41.9 39.0 - 52.0 %   MCV 85.3 80.0 - 100.0 fL   MCH 26.9 26.0 - 34.0 pg   MCHC 31.5 30.0 - 36.0 g/dL   RDW 15.2 11.5 - 15.5 %   Platelets 219 150 - 400 K/uL   nRBC 0.0 0.0 - 0.2 %   Neutrophils Relative % 50 %   Neutro Abs 3.0 1.7 - 7.7 K/uL   Lymphocytes Relative 32 %   Lymphs Abs 1.9 0.7 - 4.0 K/uL   Monocytes Relative 15 %   Monocytes Absolute 0.9 0.1 - 1.0 K/uL   Eosinophils Relative 2 %   Eosinophils Absolute 0.1 0.0 - 0.5 K/uL   Basophils Relative 1 %   Basophils Absolute 0.1 0.0 - 0.1 K/uL   Immature Granulocytes 0 %   Abs Immature Granulocytes 0.01 0.00 - 0.07 K/uL    Comment: Performed at [facility], [place], [place] [address]  Troponin I (High Sensitivity)     Status: None   Collection Time: 08/26/22  5:14 AM  Result Value Ref Range   Troponin I (High Sensitivity) 8 <18 ng/L    Comment: (NOTE) Elevated high sensitivity troponin I (hsTnI) values and significant  changes across serial measurements may suggest ACS but many other  chronic and acute conditions are known to elevate hsTnI results.  Refer to the "Links" section for chest pain algorithms and additional  guidance. Performed at [facility], [address] [place], [place] [address]   Urine Drug Screen, Qualitative     Status: Abnormal   Collection Time: 08/26/22  9:00 AM  Result Value Ref Range   Tricyclic, Ur Screen NONE DETECTED NONE DETECTED   Amphetamines, Ur Screen NONE DETECTED NONE DETECTED   MDMA (Ecstasy)Ur Screen NONE DETECTED NONE DETECTED  Cocaine Metabolite,Ur [place] POSITIVE (A) NONE DETECTED   Opiate, Ur Screen NONE DETECTED NONE DETECTED   Phencyclidine (PCP) Ur S NONE DETECTED NONE DETECTED   Cannabinoid 50 Ng, Ur [place] POSITIVE (A) NONE DETECTED   Barbiturates, Ur Screen NONE DETECTED NONE DETECTED   Benzodiazepine, Ur Scrn NONE DETECTED NONE DETECTED   Methadone  Scn, Ur NONE DETECTED NONE DETECTED    Comment: (NOTE) Tricyclics + metabolites, urine    Cutoff 1000 ng/mL Amphetamines + metabolites, urine  Cutoff 1000 ng/mL MDMA (Ecstasy), urine              Cutoff 500 ng/mL Cocaine Metabolite, urine          Cutoff 300 ng/mL Opiate + metabolites, urine        Cutoff 300 ng/mL Phencyclidine (PCP), urine         Cutoff 25 ng/mL Cannabinoid, urine                 Cutoff 50 ng/mL Barbiturates + metabolites, urine  Cutoff 200 ng/mL Benzodiazepine, urine              Cutoff 200 ng/mL Methadone, urine                   Cutoff 300 ng/mL  The urine drug screen provides only a preliminary, unconfirmed analytical test result and should not be used for non-medical purposes. Clinical consideration and professional judgment should be applied to any positive drug screen result due to possible interfering substances. A more specific alternate chemical method must be used in order to obtain a confirmed analytical result. Gas chromatography / mass spectrometry (GC/MS) is the preferred confirm atory method. Performed at [facility], [address] [place], [place] [address]   Urinalysis, Routine w reflex microscopic Urine, Random     Status: Abnormal   Collection Time: 08/26/22  9:00 AM  Result Value Ref Range   Color, Urine AMBER (A) YELLOW    Comment: BIOCHEMICALS MAY BE AFFECTED BY COLOR   APPearance HAZY (A) CLEAR   Specific Gravity, Urine 1.033 (H) 1.005 - 1.030   pH 5.0 5.0 - 8.0   Glucose, UA NEGATIVE NEGATIVE mg/dL   Hgb urine dipstick NEGATIVE NEGATIVE   Bilirubin Urine NEGATIVE NEGATIVE   Ketones, ur 20 (A) NEGATIVE mg/dL   Protein, ur 30 (A) NEGATIVE mg/dL   Nitrite NEGATIVE NEGATIVE   Leukocytes,Ua NEGATIVE NEGATIVE   RBC / HPF 0-5 0 - 5 RBC/hpf   WBC, UA 11-20 0 - 5 WBC/hpf   Bacteria, UA MANY (A) NONE SEEN   Squamous Epithelial / LPF NONE SEEN 0 - 5   Mucus PRESENT    Hyaline Casts, UA PRESENT     Comment: Performed at  [facility], [address] [place], [place] [address]    Current Facility-Administered Medications  Medication Dose Route Frequency Provider Last Rate Last Admin   apixaban (ELIQUIS) tablet 5 mg  5 mg Oral BID Barrie FolkGreenwood, Howard F, RPH   5 mg at 08/26/22 2205   fosfomycin (MONUROL) packet 3 g  3 g Oral Once Irean HongSung, Jade J, MD       hydrOXYzine (ATARAX) tablet 25 mg  25 mg Oral TID PRN Vanetta MuldersBarthold, Louise F, NP       mirtazapine (REMERON SOL-TAB) disintegrating tablet 7.5 mg  7.5 mg Oral QHS Gabriel CirriBarthold, Louise F, NP   7.5 mg at 08/26/22 2205   ondansetron (ZOFRAN-ODT) disintegrating tablet 4 mg  4 mg Oral Q8H  PRN Arnaldo Natal, MD       Current Outpatient Medications  Medication Sig Dispense Refill   apixaban (ELIQUIS) 5 MG TABS tablet Advised to take Eliquis 10 mg twice daily for 7 days followed by 5 mg twice daily for next 6 to 8 months. 60 tablet 3   apixaban (ELIQUIS) 5 MG TABS tablet Take 2 tablets (10mg ) twice daily for 7 days, then 1 tablet (5mg ) twice daily (Patient not taking: Reported on 08/26/2022) 60 tablet 3   benazepril-hydrochlorthiazide (LOTENSIN HCT) 5-6.25 MG tablet Take 1 tablet by mouth daily. Pt. Does not know mg/dose (Patient not taking: Reported on 08/26/2022)     enalapril (VASOTEC) 5 MG tablet Take 1 tablet (5 mg total) by mouth daily. 30 tablet 3   metFORMIN (GLUCOPHAGE) 500 MG tablet Take 1 tablet (500 mg total) by mouth 2 (two) times daily with a meal. 60 tablet 3    Musculoskeletal: Strength & Muscle Tone: within normal limits Gait & Station: normal Patient leans: N/A  Psychiatric Specialty Exam: Physical Exam Vitals and nursing note reviewed.  Constitutional:      Appearance: Normal appearance.  HENT:     Head: Normocephalic.     Nose: Nose normal. No congestion or rhinorrhea.  Eyes:     General:        Right eye: No discharge.        Left eye: No discharge.  Pulmonary:     Effort: Pulmonary effort is normal.  Musculoskeletal:         General: Normal range of motion.     Cervical back: Normal range of motion.  Neurological:     General: No focal deficit present.     Mental Status: He is alert and oriented to person, place, and time.  Psychiatric:        Attention and Perception: Attention normal.        Mood and Affect: Mood is depressed.        Speech: Speech normal.        Behavior: Behavior normal.        Thought Content: Thought content normal. Thought content is not paranoid or delusional. Thought content does not include homicidal or suicidal ideation.        Cognition and Memory: Cognition normal.        Judgment: Judgment normal.     Review of Systems  Constitutional: Negative.   HENT: Negative.    Eyes: Negative.   Respiratory: Negative.    Musculoskeletal: Negative.   Skin: Negative.   Psychiatric/Behavioral:  Positive for depression (stable) and substance abuse. Negative for hallucinations, memory loss and suicidal ideas. The patient is not nervous/anxious and does not have insomnia.     Blood pressure 127/78, pulse 76, temperature 97.7 F (36.5 C), temperature source Axillary, resp. rate 16, height 6\' 4"  (1.93 m), weight 122.9 kg, SpO2 97 %.Body mass index is 32.99 kg/m.  General Appearance: Casual  Eye Contact:  Good  Speech:  Normal Rate  Volume:  Normal  Mood:  Depressed  Affect:  Congruent  Thought Process:  Coherent  Orientation:  Full (Time, Place, and Person)  Thought Content:  WDL  Suicidal Thoughts:  No  Homicidal Thoughts:  No  Memory:  Immediate;   Good Recent;   Good Remote;   Good  Judgement:  Fair  Insight:  Fair  Psychomotor Activity:  Normal  Concentration:  Concentration: Good and Attention Span: Good  Recall:  Good  Fund of Knowledge:  Good  Language:  Good  Akathisia:  No  Handed:  Right  AIMS (if indicated):     Assets:  Leisure Time Physical Health Resilience Social Support  ADL's:  Intact  Cognition:  WNL  Sleep:         Physical Exam: Physical  Exam Vitals and nursing note reviewed.  Constitutional:      Appearance: Normal appearance.  HENT:     Head: Normocephalic.     Nose: Nose normal. No congestion or rhinorrhea.  Eyes:     General:        Right eye: No discharge.        Left eye: No discharge.  Pulmonary:     Effort: Pulmonary effort is normal.  Musculoskeletal:        General: Normal range of motion.     Cervical back: Normal range of motion.  Neurological:     General: No focal deficit present.     Mental Status: He is alert and oriented to person, place, and time.  Psychiatric:        Attention and Perception: Attention normal.        Mood and Affect: Mood is depressed.        Speech: Speech normal.        Behavior: Behavior normal.        Thought Content: Thought content normal. Thought content is not paranoid or delusional. Thought content does not include homicidal or suicidal ideation.        Cognition and Memory: Cognition normal.        Judgment: Judgment normal.    Review of Systems  Constitutional: Negative.   HENT: Negative.    Eyes: Negative.   Respiratory: Negative.    Musculoskeletal: Negative.   Skin: Negative.   Psychiatric/Behavioral:  Positive for depression (stable) and substance abuse. Negative for hallucinations, memory loss and suicidal ideas. The patient is not nervous/anxious and does not have insomnia.    Blood pressure 122/76, pulse 72, temperature 98.4 F (36.9 C), temperature source Oral, resp. rate 20, height 6\' 4"  (1.93 m), weight 122.9 kg, SpO2 95 %. Body mass index is 32.99 kg/m.  Treatment Plan Summary: Cocaine use disorder: Rehab being sought by TTS  Insomnia: Remeron 7.5 mg daily at bedtime  Anxiety:   Hydroxyzine 25 mg TID PRN  Disposition: No evidence of imminent risk to self or others at present.   Patient does not meet criteria for psychiatric inpatient admission. Supportive therapy provided about ongoing stressors. Discussed crisis plan, support from social  network, calling 911, coming to the Emergency Department, and calling Suicide Hotline.  Waylan Boga, NP 08/27/2022 7:39 AM

## 2022-08-27 NOTE — ED Provider Notes (Signed)
Emergency Medicine Observation Re-evaluation Note  Cameron Prince is a 51 y.o. male, seen on rounds today.  Pt initially presented to the ED for complaints of Nausea and Suicidal Currently, the patient is resting, voices no medical complaints.  Physical Exam  BP 122/76 (BP Location: Left Arm)   Pulse 72   Temp 98.4 F (36.9 C) (Oral)   Resp 20   Ht 6\' 4"  (1.93 m)   Wt 122.9 kg   SpO2 95%   BMI 32.99 kg/m  Physical Exam General: Resting in no acute distress Cardiac: No cyanosis Lungs: Equal rise and fall Psych: Not agitated  ED Course / MDM  EKG:EKG Interpretation  Date/Time:  Wednesday August 26 2022 03:52:58 EST Ventricular Rate:  68 PR Interval:  124 QRS Duration: 96 QT Interval:  426 QTC Calculation: 452 R Axis:   -39 Text Interpretation: Normal sinus rhythm Left axis deviation Incomplete right bundle branch block Minimal voltage criteria for LVH, may be normal variant ( R in aVL ) Anteroseptal infarct , age undetermined ST & T wave abnormality, consider inferolateral ischemia Abnormal ECG When compared with ECG of 06-Oct-2010 20:14, Vent. rate has decreased BY  44 BPM Incomplete right bundle branch block is now Present Anteroseptal infarct is now Present Confirmed by UNCONFIRMED, DOCTOR (08-Oct-2010), editor [phone]) on 08/26/2022 8:28:43 AM  I have reviewed the labs performed to date as well as medications administered while in observation.  Recent changes in the last 24 hours include no events overnight.  Plan  Current plan is for psychiatric disposition.    08/28/2022, MD 08/27/22 [phone]

## 2022-08-31 ENCOUNTER — Telehealth: Payer: Self-pay | Admitting: Pulmonary Disease

## 2022-08-31 ENCOUNTER — Ambulatory Visit (INDEPENDENT_AMBULATORY_CARE_PROVIDER_SITE_OTHER): Payer: Self-pay | Admitting: Nurse Practitioner

## 2022-08-31 NOTE — Telephone Encounter (Signed)
I called the facility, [facility] and spoke with Gerlene Burdock, to see if the patient will still be at the facility and if he will still be there if they can transport him to his appointment. Richard said he will have to locate the patient and call back to let us know if he will be able to make this appointment.

## 2022-09-01 NOTE — Telephone Encounter (Addendum)
The patient called back today. He said he is still in the facility but really needs to keep his appointment. He will ask the nurse at the facility if they will be able to transport him to this appointment and call me back.

## 2022-09-07 NOTE — Telephone Encounter (Signed)
I called the patient's phone number. Someone answered the phone and he was not with the patient. I asked him to tell the patient to call our office and he said the patient was coming to his appointment tomorrow with [place]. Nothing further is needed.

## 2022-09-08 ENCOUNTER — Ambulatory Visit (INDEPENDENT_AMBULATORY_CARE_PROVIDER_SITE_OTHER): Payer: Commercial Managed Care - HMO | Admitting: Pulmonary Disease

## 2022-09-08 ENCOUNTER — Other Ambulatory Visit: Payer: Self-pay | Admitting: Pulmonary Disease

## 2022-09-08 ENCOUNTER — Encounter: Payer: Self-pay | Admitting: Pulmonary Disease

## 2022-09-08 VITALS — BP 134/84 | HR 78 | Temp 98.3°F | Ht 76.0 in | Wt 274.2 lb

## 2022-09-08 DIAGNOSIS — R911 Solitary pulmonary nodule: Secondary | ICD-10-CM | POA: Diagnosis not present

## 2022-09-08 DIAGNOSIS — R0602 Shortness of breath: Secondary | ICD-10-CM | POA: Diagnosis not present

## 2022-09-08 DIAGNOSIS — J4489 Other specified chronic obstructive pulmonary disease: Secondary | ICD-10-CM | POA: Diagnosis not present

## 2022-09-08 DIAGNOSIS — I82890 Acute embolism and thrombosis of other specified veins: Secondary | ICD-10-CM

## 2022-09-08 LAB — NITRIC OXIDE: Nitric Oxide: 38

## 2022-09-08 MED ORDER — APIXABAN 5 MG PO TABS: 5.0000 mg | ORAL_TABLET | Freq: Two times a day (BID) | ORAL | 0 refills | Status: DC

## 2022-09-08 MED ORDER — TRELEGY ELLIPTA 100-62.5-25 MCG/ACT IN AEPB: 1.0000 | INHALATION_SPRAY | Freq: Every day | RESPIRATORY_TRACT | 0 refills | Status: DC

## 2022-09-08 NOTE — Patient Instructions (Addendum)
You have asthmatic bronchitis.  We are giving you an inhaler to try which is called Trelegy Ellipta, this is 1 puff daily.  Make sure you rinse your mouth well after you use it.  We are scheduling CT scan of the chest to reevaluate the spot in your lung you had.  This may have resolved if it was an area of pneumonia.  This is why we need to repeat the scan.  I am recommending the [facility] in [place] for your primary care.  They can also assist you with getting medications as needed.  Their phone number is [phone].  They are located at [address] in [place].  You had an appointment with the vascular surgeons office on 27 November that you missed.  This is the group that needs to keep filling your blood thinner.  I will give you a prescription to last you for a month so that you can reschedule with them.  Please call their office at [phone] to reschedule your appointment.  This is located on the second floor of [facility].  Will see you in follow-up in 3 to 4 weeks time.  Call sooner should any new problems arise.

## 2022-09-08 NOTE — Progress Notes (Signed)
Subjective:    Patient ID: Cameron Prince, male    DOB: 05-Mar-1971, 51 y.o.   MRN: [id number] Patient Care Team: Patient, No Pcp Per as PCP - General (General Practice)  Chief Complaint  Patient presents with   Consult    Lung Nodule. SOB constant for 5-6 months. Wheezing and cough. Cough with green sputum.     HPI The patient is a 51 year old recent former smoker (cigarettes/marijuana) whom I first evaluated on 26 July 2022 for the issue of left upper lobe lung nodule.  The patient was seen during admission at [facility] from 27 October through 31 July 2022.  At that time the patient had presented with a 2-day history of swelling on the right lateral neck and axillary area.  This extended to his right hand.  The patient had recently been released from prison.  He had been incarcerated for 2 years.  Evaluation at that time showed that the patient had significant thrombus extending from the right IJ and involving the right subclavian, axillary, cephalic, basilic and brachial, radial and ulnar veins.  The patient had right upper extremity thrombectomy by vascular surgery.  After that he was maintained on anticoagulation with Eliquis.  So the patient's needs for acute anticoagulation and need for thrombectomy, it was recommended that the patient followed as an outpatient for the issue of his left upper lobe nodule.  Ocular surgery had recommended at least 6 weeks of anticoagulation before anticoagulation could be discontinued.  It was discussed with the patient that the nodule could also represent infiltrate the patient had been having purulent sputum production at that time.  The patient presents today for follow-up on these issues.  He has not followed with vascular surgery as recommended.  He has not established with a primary care provider.  He notes issues with shortness of breath that have been present for approximately 6 months.  He also notes occasional wheezing and cough productive of  yellowish to greenish sputum.   Review of Systems A 10 point review of systems was performed and it is as noted above otherwise negative.  Patient Active Problem List   Diagnosis Date Noted   AKI (acute kidney injury) (HCC) 03/21/2023   Syncope 03/21/2023   Essential hypertension 03/21/2023   Type 2 diabetes mellitus without complications (HCC) 03/21/2023   History of DVT (deep vein thrombosis) 03/21/2023   DVT (deep venous thrombosis) (HCC) 07/26/2022   Lung nodule 07/26/2022   Hypertension    DMII (diabetes mellitus, type 2) (HCC)    Suicidal thoughts 12/05/2018   Cannabis use disorder, mild, abuse 12/05/2018   Cocaine use disorder, moderate, dependence (HCC) 12/05/2018   Social History   Tobacco Use   Smoking status: Former    Packs/day: 0.25    Years: 10.00    Additional pack years: 0.00    Total pack years: 2.50    Types: Cigarettes    Quit date: 07/26/2022    Years since quitting: 0.6   Smokeless tobacco: Never  Substance Use Topics   Alcohol use: Yes    Comment: occasionally   Allergies  Allergen Reactions   Lisinopril Cough   Current Meds  Medication Sig   apixaban (ELIQUIS) 5 MG TABS tablet Advised to take Eliquis 10 mg twice daily for 7 days followed by 5 mg twice daily for next 6 to 8 months.   benazepril-hydrochlorthiazide (LOTENSIN HCT) 5-6.25 MG tablet Take 1 tablet by mouth daily. Pt. Does not know mg/dose (Patient not taking: Reported on  03/21/2023)   Immunization History  Administered Date(s) Administered   PFIZER(Purple Top)SARS-COV-2 Vaccination 05/20/2020      Objective:   Physical Exam BP 134/84 (BP Location: Left Arm, Cuff Size: Large)   Pulse 78   Temp 98.3 F (36.8 C)   Ht 6\' 4"  (1.93 m)   Wt 274 lb 3.2 oz (124.4 kg)   SpO2 98%   BMI 33.38 kg/m   SpO2: 98 % O2 Device: None (Room air)     Representative image from CT performed 26 July 2022 showing a 2.4 cm nodule on the left upper lobe:   Lab Results  Component Value  Date   NITRICOXIDE 38 09/08/2022   Spirometry was performed today: FEV1 was 1.89 L or 46% of predicted, FVC was 2.89 L or 57% predicted, FEV1/FVC was 65%.  This is consistent with moderate to severe airway obstruction.     Assessment & Plan:     ICD-10-CM   1. 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 SOB (shortness of breath)  R06.02 Spirometry with Graph    Nitric oxide   Likely due to poorly compensated COPD Trial of Trelegy as above    4. 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    Standing Status:   Future    Standing Expiration Date:   09/09/2023    Order Specific Question:   Preferred imaging location?    Answer:   [facility]   Nitric oxide   Spirometry with Graph    Order Specific Question:   Where should this test be performed?    Answer:   [facility]   Meds ordered this encounter  Medications   Fluticasone-Umeclidin-Vilant (TRELEGY ELLIPTA) 100-62.5-25 MCG/ACT AEPB    Sig: Inhale 1 puff into the lungs daily.    Dispense:  28 each    Refill:  0    Order Specific Question:   Lot Number?    Answer:   [id number]    Order Specific Question:   Expiration Date?    Answer:   01/04/2024    Order Specific Question:   Quantity    Answer:   2   apixaban (ELIQUIS) 5 MG TABS tablet    Sig: Take 1 tablet (5 mg total) by mouth 2 (two) times daily.    Dispense:  60 tablet    Refill:  0   Provided the patient information with regards to [facility].  Also made referral for follow-up with vascular surgery.  Provided him with 1 more month of Eliquis  until he sees vascular surgery and determine how long his anticoagulation should be.  The patient was provided samples of Trelegy Ellipta.  CT scan of the chest has been ordered.  We will see him in follow-up in 3 to 4 weeks time he is to contact us prior to that time should any new difficulties arise.  Gailen Shelter, MD Advanced Bronchoscopy P[facility]    *This note was dictated using  voice recognition software/Dragon.  Despite best efforts to proofread, errors can occur which can change the meaning. Any transcriptional errors that result from this process are unintentional and may not be fully corrected at the time of dictation.

## 2022-09-22 ENCOUNTER — Inpatient Hospital Stay: Admission: RE | Admit: 2022-09-22 | Payer: Commercial Managed Care - HMO | Source: Ambulatory Visit

## 2022-09-22 ENCOUNTER — Other Ambulatory Visit: Payer: Commercial Managed Care - HMO

## 2022-10-27 ENCOUNTER — Ambulatory Visit: Payer: Commercial Managed Care - HMO | Admitting: Pulmonary Disease

## 2023-03-20 ENCOUNTER — Other Ambulatory Visit: Payer: Self-pay

## 2023-03-20 DIAGNOSIS — Z87891 Personal history of nicotine dependence: Secondary | ICD-10-CM | POA: Insufficient documentation

## 2023-03-20 DIAGNOSIS — Z7901 Long term (current) use of anticoagulants: Secondary | ICD-10-CM | POA: Diagnosis not present

## 2023-03-20 DIAGNOSIS — Z79899 Other long term (current) drug therapy: Secondary | ICD-10-CM | POA: Insufficient documentation

## 2023-03-20 DIAGNOSIS — N179 Acute kidney failure, unspecified: Secondary | ICD-10-CM | POA: Insufficient documentation

## 2023-03-20 DIAGNOSIS — Z86718 Personal history of other venous thrombosis and embolism: Secondary | ICD-10-CM | POA: Insufficient documentation

## 2023-03-20 DIAGNOSIS — I10 Essential (primary) hypertension: Secondary | ICD-10-CM | POA: Insufficient documentation

## 2023-03-20 DIAGNOSIS — E119 Type 2 diabetes mellitus without complications: Secondary | ICD-10-CM | POA: Diagnosis not present

## 2023-03-20 DIAGNOSIS — R55 Syncope and collapse: Secondary | ICD-10-CM | POA: Diagnosis present

## 2023-03-20 DIAGNOSIS — Z7984 Long term (current) use of oral hypoglycemic drugs: Secondary | ICD-10-CM | POA: Diagnosis not present

## 2023-03-20 LAB — BASIC METABOLIC PANEL
Anion gap: 14 (ref 5–15)
BUN: 37 mg/dL — ABNORMAL HIGH (ref 6–20)
CO2: 19 mmol/L — ABNORMAL LOW (ref 22–32)
Calcium: 10.1 mg/dL (ref 8.9–10.3)
Chloride: 99 mmol/L (ref 98–111)
Creatinine, Ser: 2.37 mg/dL — ABNORMAL HIGH (ref 0.61–1.24)
GFR, Estimated: 32 mL/min — ABNORMAL LOW (ref >60)
Glucose, Bld: 118 mg/dL — ABNORMAL HIGH (ref 70–99)
Potassium: 4.2 mmol/L (ref 3.5–5.1)
Sodium: 132 mmol/L — ABNORMAL LOW (ref 135–145)

## 2023-03-20 NOTE — ED Triage Notes (Addendum)
FIRST NURSE NOTE:  Pt arrived via [facility] recently released from jail, found on the street c/o abdominal cramping and lightheaded.  162/101 P-89 CBG 109 98%RA

## 2023-03-20 NOTE — ED Notes (Signed)
Blue top sent down. 

## 2023-03-20 NOTE — ED Triage Notes (Signed)
Pt to ed [facility] streets via [facility] for abdominal cramping and near syncope. Pt is caox4, in no acute distress in triage, and on his phone during entire triage. Pt has not been taking his blood thinners as prescribed and he is curious if that could cause his issues.

## 2023-03-21 ENCOUNTER — Observation Stay
Admission: EM | Admit: 2023-03-21 | Discharge: 2023-03-23 | Disposition: A | Discharge status: Home / Self Care | Payer: Commercial Managed Care - HMO | Attending: Internal Medicine | Admitting: Internal Medicine

## 2023-03-21 ENCOUNTER — Observation Stay (HOSPITAL_BASED_OUTPATIENT_CLINIC_OR_DEPARTMENT_OTHER)
Admit: 2023-03-21 | Discharge: 2023-03-21 | Disposition: A | Discharge status: Home / Self Care | Payer: Commercial Managed Care - HMO | Attending: Family Medicine | Admitting: Family Medicine

## 2023-03-21 DIAGNOSIS — N179 Acute kidney failure, unspecified: Secondary | ICD-10-CM | POA: Diagnosis present

## 2023-03-21 DIAGNOSIS — R55 Syncope and collapse: Secondary | ICD-10-CM

## 2023-03-21 DIAGNOSIS — I10 Essential (primary) hypertension: Secondary | ICD-10-CM

## 2023-03-21 DIAGNOSIS — E119 Type 2 diabetes mellitus without complications: Secondary | ICD-10-CM | POA: Diagnosis not present

## 2023-03-21 DIAGNOSIS — Z86718 Personal history of other venous thrombosis and embolism: Secondary | ICD-10-CM

## 2023-03-21 LAB — BASIC METABOLIC PANEL
Anion gap: 11 (ref 5–15)
BUN: 33 mg/dL — ABNORMAL HIGH (ref 6–20)
CO2: 21 mmol/L — ABNORMAL LOW (ref 22–32)
Calcium: 9.2 mg/dL (ref 8.9–10.3)
Chloride: 102 mmol/L (ref 98–111)
Creatinine, Ser: 1.76 mg/dL — ABNORMAL HIGH (ref 0.61–1.24)
GFR, Estimated: 46 mL/min — ABNORMAL LOW (ref >60)
Glucose, Bld: 155 mg/dL — ABNORMAL HIGH (ref 70–99)
Potassium: 3.8 mmol/L (ref 3.5–5.1)
Sodium: 134 mmol/L — ABNORMAL LOW (ref 135–145)

## 2023-03-21 LAB — CBC
HCT: 51.5 % (ref 39.0–52.0)
HCT: 46.4 % (ref 39.0–52.0)
Hemoglobin: 16.4 g/dL (ref 13.0–17.0)
Hemoglobin: 14.7 g/dL (ref 13.0–17.0)
MCH: 27.7 pg (ref 26.0–34.0)
MCH: 27.7 pg (ref 26.0–34.0)
MCHC: 31.8 g/dL (ref 30.0–36.0)
MCHC: 31.7 g/dL (ref 30.0–36.0)
MCV: 86.8 fL (ref 80.0–100.0)
MCV: 87.4 fL (ref 80.0–100.0)
Platelets: 285 10*3/uL (ref 150–400)
Platelets: 261 10*3/uL (ref 150–400)
RBC: 5.93 MIL/uL — ABNORMAL HIGH (ref 4.22–5.81)
RBC: 5.31 MIL/uL (ref 4.22–5.81)
RDW: 13.4 % (ref 11.5–15.5)
RDW: 13.2 % (ref 11.5–15.5)
WBC: 7.9 10*3/uL (ref 4.0–10.5)
WBC: 7.5 10*3/uL (ref 4.0–10.5)
nRBC: 0 % (ref 0.0–0.2)
nRBC: 0 % (ref 0.0–0.2)

## 2023-03-21 LAB — URINE DRUG SCREEN, QUALITATIVE (ARMC ONLY)
Amphetamines, Ur Screen: NOT DETECTED
Barbiturates, Ur Screen: NOT DETECTED
Benzodiazepine, Ur Scrn: NOT DETECTED
Cannabinoid 50 Ng, Ur ~~LOC~~: NOT DETECTED
Cocaine Metabolite,Ur ~~LOC~~: POSITIVE — AB
MDMA (Ecstasy)Ur Screen: NOT DETECTED
Methadone Scn, Ur: NOT DETECTED
Opiate, Ur Screen: NOT DETECTED
Phencyclidine (PCP) Ur S: NOT DETECTED
Tricyclic, Ur Screen: NOT DETECTED

## 2023-03-21 LAB — CBG MONITORING, ED
Glucose-Capillary: 145 mg/dL — ABNORMAL HIGH (ref 70–99)
Glucose-Capillary: 121 mg/dL — ABNORMAL HIGH (ref 70–99)
Glucose-Capillary: 131 mg/dL — ABNORMAL HIGH (ref 70–99)

## 2023-03-21 LAB — ECHOCARDIOGRAM COMPLETE
AR max vel: 3.54 cm2
AV Peak grad: 7.4 mmHg
Ao pk vel: 1.36 m/s
Area-P 1/2: 2 cm2
S' Lateral: 3.5 cm

## 2023-03-21 LAB — URINALYSIS, ROUTINE W REFLEX MICROSCOPIC
Bacteria, UA: NONE SEEN
Bilirubin Urine: NEGATIVE
Glucose, UA: NEGATIVE mg/dL
Hgb urine dipstick: NEGATIVE
Ketones, ur: 5 mg/dL — AB
Leukocytes,Ua: NEGATIVE
Nitrite: NEGATIVE
Protein, ur: 30 mg/dL — AB
Specific Gravity, Urine: 1.026 (ref 1.005–1.030)
pH: 5 (ref 5.0–8.0)

## 2023-03-21 LAB — GLUCOSE, CAPILLARY
Glucose-Capillary: 89 mg/dL (ref 70–99)
Glucose-Capillary: 88 mg/dL (ref 70–99)

## 2023-03-21 LAB — CK: Total CK: 9704 U/L — ABNORMAL HIGH (ref 49–397)

## 2023-03-21 LAB — HEMOGLOBIN A1C
Hgb A1c MFr Bld: 5.2 % (ref 4.8–5.6)
Mean Plasma Glucose: 102.54 mg/dL

## 2023-03-21 MED ORDER — MAGNESIUM HYDROXIDE 400 MG/5ML PO SUSP: 30.0000 mL | Freq: Every day | ORAL | Status: DC | PRN

## 2023-03-21 MED ORDER — SODIUM CHLORIDE 0.9 % IV BOLUS
1000.0000 mL | Freq: Once | INTRAVENOUS | Status: AC
  Administered 2023-03-21: 1000 mL via INTRAVENOUS

## 2023-03-21 MED ORDER — INSULIN ASPART 100 UNIT/ML IJ SOLN
0.0000 [IU] | Freq: Every day | INTRAMUSCULAR | Status: DC
  Administered 2023-03-22: 2 [IU] via SUBCUTANEOUS
  Filled 2023-03-21: qty 1

## 2023-03-21 MED ORDER — ACETAMINOPHEN 325 MG PO TABS: 650.0000 mg | ORAL_TABLET | Freq: Four times a day (QID) | ORAL | Status: DC | PRN

## 2023-03-21 MED ORDER — TRAZODONE HCL 50 MG PO TABS: 25.0000 mg | ORAL_TABLET | Freq: Every evening | ORAL | Status: DC | PRN

## 2023-03-21 MED ORDER — ONDANSETRON HCL 4 MG/2ML IJ SOLN: 4.0000 mg | Freq: Four times a day (QID) | INTRAMUSCULAR | Status: DC | PRN

## 2023-03-21 MED ORDER — ONDANSETRON HCL 4 MG PO TABS: 4.0000 mg | ORAL_TABLET | Freq: Four times a day (QID) | ORAL | Status: DC | PRN

## 2023-03-21 MED ORDER — ACETAMINOPHEN 650 MG RE SUPP: 650.0000 mg | Freq: Four times a day (QID) | RECTAL | Status: DC | PRN

## 2023-03-21 MED ORDER — SODIUM CHLORIDE 0.9 % IV SOLN: INTRAVENOUS | Status: DC

## 2023-03-21 MED ORDER — APIXABAN 5 MG PO TABS
5.0000 mg | ORAL_TABLET | Freq: Two times a day (BID) | ORAL | Status: DC
  Administered 2023-03-21 – 2023-03-23 (×5): 5 mg via ORAL
  Filled 2023-03-21 (×5): qty 1

## 2023-03-21 MED ORDER — INSULIN ASPART 100 UNIT/ML IJ SOLN
0.0000 [IU] | Freq: Three times a day (TID) | INTRAMUSCULAR | Status: DC
  Administered 2023-03-21 – 2023-03-22 (×3): 1 [IU] via SUBCUTANEOUS
  Filled 2023-03-21 (×3): qty 1

## 2023-03-21 MED ORDER — SODIUM CHLORIDE 0.9% FLUSH
3.0000 mL | Freq: Two times a day (BID) | INTRAVENOUS | Status: DC
  Administered 2023-03-21 – 2023-03-23 (×3): 3 mL via INTRAVENOUS

## 2023-03-21 MED ORDER — MORPHINE SULFATE (PF) 2 MG/ML IV SOLN: 1.0000 mg | INTRAVENOUS | Status: DC | PRN

## 2023-03-21 MED ORDER — OXYCODONE-ACETAMINOPHEN 5-325 MG PO TABS
1.0000 | ORAL_TABLET | Freq: Four times a day (QID) | ORAL | Status: DC | PRN
  Administered 2023-03-21: 1 via ORAL
  Filled 2023-03-21: qty 1

## 2023-03-21 NOTE — Assessment & Plan Note (Signed)
-   The patient will be admitted to an observation medically monitored bed. - Will check orthostatics q 12 hours. - Will obtain a 2D echo. - The patient will be gently hydrated with IV normal saline and monitored for arrhythmias. -Differential diagnoses would include neurally mediated syncope, cardiogenic, arrhythmias related,  orthostatic hypotension and less likely hypoglycemia.

## 2023-03-21 NOTE — Progress Notes (Signed)
  Echocardiogram 2D Echocardiogram has been performed.  Lenor Coffin 03/21/2023, 3:01 PM

## 2023-03-21 NOTE — Assessment & Plan Note (Signed)
-   The patient will be placed on supplemental coverage with NovoLog. - We will hold off metformin given his acute kidney injury.

## 2023-03-21 NOTE — Progress Notes (Signed)
PROGRESS NOTE    Cameron Prince  ZOX:[id number] DOB: [birth year] DOA: 03/21/2023 PCP: Patient, No Pcp Per   Assessment & Plan:   Principal Problem:   Syncope Active Problems:   AKI (acute kidney injury) (HCC)   Type 2 diabetes mellitus without complications (HCC)   Essential hypertension   History of DVT (deep vein thrombosis)  Assessment and Plan: Syncope: etiology unclear, ddx cocaine use vs arrhythmias vs vasovagal vs dehydration. Orthostatic vitals are neg. Continue on IVFs. Echo ordered. Continue on tele   Cocaine abuse: likely secondary to above. Received illicit drug use cessation counseling   AKI: likely secondary to dehydration & cocaine use. Continue on IVFs   Metabolic acidosis: continue on IVFs.    DM2: well controlled, HbA1c 5.2 Continue on SSI w/ accuchecks    HTN: holding home dose of enalapril secondary to AKI    Hx of DVT: continue on eliquis    DVT prophylaxis: eliquis Code Status: full  Family Communication:  Disposition Plan: likely d/c back home   Level of care: Telemetry Medical  Status is: Observation The patient remains OBS appropriate and will d/c before 2 midnights.    Consultants:    Procedures:   Antimicrobials:    Subjective: Pt c/o body aches  Objective: Vitals:   03/21/23 0455 03/21/23 0500 03/21/23 0504 03/21/23 0700  BP: (S) 139/88 (S) (!) 118/97 (S) (!) 141/117 113/65  Pulse: (S) 83 84 (S) (!) 107 71  Resp: (S) 15 (S) 16 (S) 20 16  Temp:    98 F (36.7 C)  TempSrc:    Oral  SpO2: (S) 96% (S) 93% (S) 96% 94%  Weight:      Height:       No intake or output data in the 24 hours ending 03/21/23 0840 Filed Weights   03/20/23 2320  Weight: 129.3 kg    Examination:  General exam: Appears calm and comfortable  Respiratory system: Clear to auscultation. Respiratory effort normal. Cardiovascular system: S1 & S+. No rubs, gallops or clicks.  Gastrointestinal system: Abdomen is nondistended, soft and nontender. Normal  bowel sounds heard. Central nervous system: Alert and oriented. Chronic stutter  Psychiatry: Judgement and insight appear normal. Mood & affect appropriate.     Data Reviewed: I have personally reviewed following labs and imaging studies  CBC: Recent Labs  Lab 03/20/23 2321 03/21/23 0500  WBC 7.9 7.5  HGB 16.4 14.7  HCT 51.5 46.4  MCV 86.8 87.4  PLT 285 261   Basic Metabolic Panel: Recent Labs  Lab 03/20/23 2321 03/21/23 0500  NA 132* 134*  K 4.2 3.8  CL 99 102  CO2 19* 21*  GLUCOSE 118* 155*  BUN 37* 33*  CREATININE 2.37* 1.76*  CALCIUM 10.1 9.2   GFR: Estimated Creatinine Clearance: 72.9 mL/min (A) (by C-G formula based on SCr of 1.76 mg/dL (H)). Liver Function Tests: No results for input(s): "AST", "ALT", "ALKPHOS", "BILITOT", "PROT", "ALBUMIN" in the last 168 hours. No results for input(s): "LIPASE", "AMYLASE" in the last 168 hours. No results for input(s): "AMMONIA" in the last 168 hours. Coagulation Profile: No results for input(s): "INR", "PROTIME" in the last 168 hours. Cardiac Enzymes: No results for input(s): "CKTOTAL", "CKMB", "CKMBINDEX", "TROPONINI" in the last 168 hours. BNP (last 3 results) No results for input(s): "PROBNP" in the last 8760 hours. HbA1C: Recent Labs    03/20/23 2321  HGBA1C 5.2   CBG: Recent Labs  Lab 03/21/23 0502 03/21/23 0715  GLUCAP 145* 121*   Lipid  Profile: No results for input(s): "CHOL", "HDL", "LDLCALC", "TRIG", "CHOLHDL", "LDLDIRECT" in the last 72 hours. Thyroid Function Tests: No results for input(s): "TSH", "T4TOTAL", "FREET4", "T3FREE", "THYROIDAB" in the last 72 hours. Anemia Panel: No results for input(s): "VITAMINB12", "FOLATE", "FERRITIN", "TIBC", "IRON", "RETICCTPCT" in the last 72 hours. Sepsis Labs: No results for input(s): "PROCALCITON", "LATICACIDVEN" in the last 168 hours.  No results found for this or any previous visit (from the past 240 hour(s)).       Radiology Studies: No results  found.      Scheduled Meds:  apixaban  5 mg Oral BID   insulin aspart  0-5 Units Subcutaneous QHS   insulin aspart  0-9 Units Subcutaneous TID WC   sodium chloride flush  3 mL Intravenous Q12H   Continuous Infusions:  sodium chloride 150 mL/hr at 03/21/23 0519     LOS: 0 days    Time spent: 35 mins     Charise Killian, MD [facility] Pager [phone]  If 7PM-7AM, please contact night-coverage [email or web address] 03/21/2023, 8:40 AM

## 2023-03-21 NOTE — ED Provider Notes (Signed)
[facility] Provider Note   Event Date/Time   First MD Initiated Contact with Patient 03/21/23 0011     (approximate) History  Near Syncope and Abdominal Pain  HPI Cameron Prince is a 52 y.o. male with a stated past medical history of hypertension who presents complaining of near syncope as well as generalized abdominal pain that has been present over the last 2 days.  Patient states that he was walking yesterday when he felt significantly lightheaded and when he sat down he ended up waking up on a curb after passing out.  Patient states that he has been eating and drinking as well as he can however he has recently been released from prison where he has been unable to get his normally prescribed medications.  Patient denies knowing what medications he should be on ROS: Patient currently denies any vision changes, tinnitus, difficulty speaking, facial droop, sore throat, chest pain, shortness of breath, nausea/vomiting/diarrhea, dysuria, or weakness/numbness/paresthesias in any extremity   Physical Exam  Triage Vital Signs: ED Triage Vitals  Enc Vitals Group     BP 03/20/23 2319 (!) 140/109     Pulse Rate 03/20/23 2319 90     Resp 03/20/23 2319 16     Temp 03/20/23 2319 97.6 F (36.4 C)     Temp Source 03/20/23 2319 Oral     SpO2 03/20/23 2319 99 %     Weight 03/20/23 2320 285 lb (129.3 kg)     Height 03/20/23 2320 6\' 4"  (1.93 m)     Head Circumference --      Peak Flow --      Pain Score 03/20/23 2319 8     Pain Loc --      Pain Edu? --      Excl. in GC? --    Most recent vital signs: Vitals:   03/21/23 0504 03/21/23 0700  BP: (S) (!) 141/117 113/65  Pulse: (S) (!) 107 71  Resp: (S) 20 16  Temp:  98 F (36.7 C)  SpO2: (S) 96% 94%   General: Awake, oriented x4. CV:  Good peripheral perfusion.  Resp:  Normal effort.  Abd:  No distention.  Other:  Obese middle-aged African-American male laying in bed in no acute distress ED Results / Procedures /  Treatments  Labs (all labs ordered are listed, but only abnormal results are displayed) Labs Reviewed  BASIC METABOLIC PANEL - Abnormal; Notable for the following components:      Result Value   Sodium 132 (*)    CO2 19 (*)    Glucose, Bld 118 (*)    BUN 37 (*)    Creatinine, Ser 2.37 (*)    GFR, Estimated 32 (*)    All other components within normal limits  CBC - Abnormal; Notable for the following components:   RBC 5.93 (*)    All other components within normal limits  BASIC METABOLIC PANEL - Abnormal; Notable for the following components:   Sodium 134 (*)    CO2 21 (*)    Glucose, Bld 155 (*)    BUN 33 (*)    Creatinine, Ser 1.76 (*)    GFR, Estimated 46 (*)    All other components within normal limits  CBG MONITORING, ED - Abnormal; Notable for the following components:   Glucose-Capillary 145 (*)    All other components within normal limits  CBG MONITORING, ED - Abnormal; Notable for the following components:   Glucose-Capillary 121 (*)  All other components within normal limits  CBC  URINALYSIS, ROUTINE W REFLEX MICROSCOPIC  HEMOGLOBIN A1C   EKG ED ECG REPORT I, Merwyn Katos, the attending physician, personally viewed and interpreted this ECG. Date: 03/21/2023 EKG Time: 2318 Rate: 94 Rhythm: normal sinus rhythm QRS Axis: normal Intervals: normal ST/T Wave abnormalities: normal Narrative Interpretation: no evidence of acute ischemia PROCEDURES: Critical Care performed: No .1-3 Lead EKG Interpretation  Performed by: Merwyn Katos, MD Authorized by: Merwyn Katos, MD     Interpretation: normal     ECG rate:  71   ECG rate assessment: normal     Rhythm: sinus rhythm     Ectopy: none     Conduction: normal    MEDICATIONS ORDERED IN ED: Medications  apixaban (ELIQUIS) tablet 5 mg (has no administration in time range)  sodium chloride flush (NS) 0.9 % injection 3 mL (has no administration in time range)  0.9 %  sodium chloride infusion (  Intravenous New Bag/Given 03/21/23 0519)  acetaminophen (TYLENOL) tablet 650 mg (has no administration in time range)    Or  acetaminophen (TYLENOL) suppository 650 mg (has no administration in time range)  traZODone (DESYREL) tablet 25 mg (has no administration in time range)  magnesium hydroxide (MILK OF MAGNESIA) suspension 30 mL (has no administration in time range)  ondansetron (ZOFRAN) tablet 4 mg (has no administration in time range)    Or  ondansetron (ZOFRAN) injection 4 mg (has no administration in time range)  insulin aspart (novoLOG) injection 0-9 Units (has no administration in time range)  insulin aspart (novoLOG) injection 0-5 Units (has no administration in time range)  sodium chloride 0.9 % bolus 1,000 mL (0 mLs Intravenous Stopped 03/21/23 0415)   IMPRESSION / MDM / ASSESSMENT AND PLAN / ED COURSE  I reviewed the triage vital signs and the nursing notes.                             The patient is on the cardiac monitor to evaluate for evidence of arrhythmia and/or significant heart rate changes. Patient's presentation is most consistent with acute presentation with potential threat to life or bodily function.  This patient presents to the ED for concern of abdominal pain and syncope, this involves an extensive number of treatment options, and is a complaint that carries with it a high risk of complications and morbidity.  The differential diagnosis includes acute renal failure, dehydration, vasovagal syncope, ACS, PE Co morbidities that complicate the patient evaluation  Hypertension Lab Tests:  I Ordered, and personally interpreted labs.  The pertinent results include: Creatinine 1.76, BUN 33, glucose 155  I agree with the radiologist interpretation Cardiac Monitoring: / EKG:  The patient was maintained on a cardiac monitor.  I personally viewed and interpreted the cardiac monitored which showed an underlying rhythm of: Normal sinus rhythm Consultations Obtained:  I  requested consultation with the Dr. Arville Care,  and discussed lab and imaging findings as well as pertinent plan - they recommend: Admission to the internal medicine service Problem List / ED Course / Critical interventions / Medication management  AKI, syncope  I ordered medication including normal saline for AKI  Reevaluation of the patient after these medicines showed that the patient stayed the same  I have reviewed the patients home medicines and have made adjustments as needed Dispo: Admit to medicine       FINAL CLINICAL IMPRESSION(S) / ED DIAGNOSES  Final diagnoses:  None   Rx / DC Orders   ED Discharge Orders     None      Note:  This document was prepared using Dragon voice recognition software and may include unintentional dictation errors.   Merwyn Katos, MD 03/21/23 [id number]

## 2023-03-21 NOTE — Assessment & Plan Note (Signed)
-   We will continue Eliquis. 

## 2023-03-21 NOTE — Assessment & Plan Note (Signed)
-   We will hold off his ACE inhibitor therapy as well as diuretic therapy given his acute kidney injury. - We will place him on as needed IV labetalol

## 2023-03-21 NOTE — H&P (Addendum)
[place]   PATIENT NAME: Cameron Prince    MR#:  [id number]  DATE OF BIRTH:  [birth year]  DATE OF ADMISSION:  03/21/2023  PRIMARY CARE PHYSICIAN: Patient, No Pcp Per   Patient is coming from: Home  REQUESTING/REFERRING PHYSICIAN: Donna Bernard, MD  CHIEF COMPLAINT:   Chief Complaint  Patient presents with   Near Syncope   Abdominal Pain    HISTORY OF PRESENT ILLNESS:  Cameron Prince is a 52 y.o. male with medical history significant for hypertension, type diabetes mellitus, history of DVT, PTSD and depression, presented to the emergency room with acute onset of syncope that lasted about 1 to 2 minutes.  He has been feet paresthesias but denies any new paresthesias or focal muscle weakness.  No chest pain or palpitations.  No nausea or vomiting or diarrhea.  He apparently had abdominal pain earlier in the evening but currently denied it.  No fever or chills.  No dysuria, oliguria or hematuria or flank pain.  He denies any cough or wheezing or hemoptysis.  ED Course: When he came to the ER, his BP was 140/109 with otherwise normal vital signs.  Labs revealed mild hyponatremia 132 and CO2 was 19 with blood glucose of 118, BUN of 37 creatinine 2.37 compared to 14 and 0.95 on 08/26/2022.  CBC was unremarkable. EKG as reviewed by me : EKG showed normal sinus rhythm with a rate of 94 with incomplete right bundle branch block, left intrafascicular block and LVH with T wave inversion laterally. Imaging: None.  The patient was given 1 L bolus of IV normal saline.  He will be admitted to an observation medical telemetry bed for further evaluation and management. PAST MEDICAL HISTORY:   Past Medical History:  Diagnosis Date   Depression    Hypertension    PTSD (post-traumatic stress disorder)   -Type 2 diabetes mellitus - History of DVT PAST SURGICAL HISTORY:   Past Surgical History:  Procedure Laterality Date   ABDOMINAL SURGERY     PERIPHERAL VASCULAR THROMBECTOMY Right  07/29/2022   Procedure: PERIPHERAL VASCULAR THROMBECTOMY;  Surgeon: Renford Dills, MD;  Location: [facility] INVASIVE CV LAB;  Service: Cardiovascular;  Laterality: Right;    SOCIAL HISTORY:   Social History   Tobacco Use   Smoking status: Former    Packs/day: 0.25    Years: 10.00    Additional pack years: 0.00    Total pack years: 2.50    Types: Cigarettes    Quit date: 07/26/2022    Years since quitting: 0.6   Smokeless tobacco: Never  Substance Use Topics   Alcohol use: Yes    Comment: occasionally    FAMILY HISTORY:   Family History  Problem Relation Age of Onset   Heart disease Mother    Diabetes Father     DRUG ALLERGIES:   Allergies  Allergen Reactions   Lisinopril Cough    REVIEW OF SYSTEMS:   ROS As per history of present illness. All pertinent systems were reviewed above. Constitutional, HEENT, cardiovascular, respiratory, GI, GU, musculoskeletal, neuro, psychiatric, endocrine, integumentary and hematologic systems were reviewed and are otherwise negative/unremarkable except for positive findings mentioned above in the HPI.   MEDICATIONS AT HOME:   Prior to Admission medications   Medication Sig Start Date End Date Taking? Authorizing Provider  apixaban (ELIQUIS) 5 MG TABS tablet Take 1 tablet (5 mg total) by mouth 2 (two) times daily. Patient not taking: Reported on 03/21/2023 09/08/22   Sarina Ser  L, MD  benazepril-hydrochlorthiazide (LOTENSIN HCT) 5-6.25 MG tablet Take 1 tablet by mouth daily. Pt. Does not know mg/dose Patient not taking: Reported on 03/21/2023    [provider]  enalapril (VASOTEC) 5 MG tablet Take 1 tablet (5 mg total) by mouth daily. 12/07/19 04/05/20  Menshew, Charlesetta Ivory, PA-C  Fluticasone-Umeclidin-Vilant (TRELEGY ELLIPTA) 100-62.5-25 MCG/ACT AEPB Inhale 1 puff into the lungs daily. Patient not taking: Reported on 03/21/2023 09/08/22   Salena Saner, MD  metFORMIN (GLUCOPHAGE) 500 MG tablet Take 1 tablet (500 mg  total) by mouth 2 (two) times daily with a meal. 12/07/19 04/05/20  Menshew, Charlesetta Ivory, PA-C      VITAL SIGNS:  Blood pressure (S) (!) 141/117, pulse (S) (!) 107, temperature 98.9 F (37.2 C), temperature source Oral, resp. rate (S) 20, height 6\' 4"  (1.93 m), weight 129.3 kg, SpO2 (S) 96 %.  PHYSICAL EXAMINATION:  Physical Exam  GENERAL:  52 y.o.-year-old African-American male patient lying in the bed with no acute distress.  EYES: Pupils equal, round, reactive to light and accommodation. No scleral icterus. Extraocular muscles intact.  HEENT: Head atraumatic, normocephalic. Oropharynx and nasopharynx clear.  NECK:  Supple, no jugular venous distention. No thyroid enlargement, no tenderness.  LUNGS: Normal breath sounds bilaterally, no wheezing, rales,rhonchi or crepitation. No use of accessory muscles of respiration.  CARDIOVASCULAR: Regular rate and rhythm, S1, S2 normal. No murmurs, rubs, or gallops.  ABDOMEN: Soft, nondistended, nontender. Bowel sounds present. No organomegaly or mass.  EXTREMITIES: No pedal edema, cyanosis, or clubbing.  NEUROLOGIC: Cranial nerves II through XII are intact. Muscle strength 5/5 in all extremities. Sensation intact. Gait not checked.  PSYCHIATRIC: The patient is alert and oriented x 3.  Normal affect and good eye contact. SKIN: No obvious rash, lesion, or ulcer.   LABORATORY PANEL:   CBC Recent Labs  Lab 03/21/23 0500  WBC PENDING  HGB 14.7  HCT 46.4  PLT 261   ------------------------------------------------------------------------------------------------------------------  Chemistries  Recent Labs  Lab 03/20/23 2321  NA 132*  K 4.2  CL 99  CO2 19*  GLUCOSE 118*  BUN 37*  CREATININE 2.37*  CALCIUM 10.1   ------------------------------------------------------------------------------------------------------------------  Cardiac Enzymes No results for input(s): "TROPONINI" in the last 168  hours. ------------------------------------------------------------------------------------------------------------------  RADIOLOGY:  No results found.    IMPRESSION AND PLAN:  Assessment and Plan: * Syncope - The patient will be admitted to an observation medically monitored bed. - Will check orthostatics q 12 hours. - Will obtain a 2D echo. - The patient will be gently hydrated with IV normal saline and monitored for arrhythmias. -Differential diagnoses would include neurally mediated syncope, cardiogenic, arrhythmias related,  orthostatic hypotension and less likely hypoglycemia.    AKI (acute kidney injury) (HCC) - I suspect prerenal etiology due to volume depletion and dehydration. - We will continue hydration with IV normal saline and follow BMP. - We will avoid nephrotoxins.  Type 2 diabetes mellitus without complications (HCC) - The patient will be placed on supplemental coverage with NovoLog. - We will hold off metformin given his acute kidney injury.  Essential hypertension - We will hold off his ACE inhibitor therapy as well as diuretic therapy given his acute kidney injury. - We will place him on as needed IV labetalol   History of DVT (deep vein thrombosis) - We will continue Eliquis.   DVT prophylaxis: Eliquis. Advanced Care Planning:  Code Status: full code.  Family Communication:  The plan of care was discussed in details with  the patient (and family). I answered all questions. The patient agreed to proceed with the above mentioned plan. Further management will depend upon hospital course. Disposition Plan: Back to previous home environment Consults called: none.  All the records are reviewed and case discussed with ED provider.  Status is: Observation   I certify that at the time of admission, it is my clinical judgment that the patient will require  hospital care extending less than 2 midnights.                            Dispo: The patient is  from: Home              Anticipated d/c is to: Home              Patient currently is not medically stable to d/c.              Difficult to place patient: No  Hannah Beat M.D on 03/21/2023 at 5:23 AM  [facility]   From 7 PM-7 AM, contact night-coverage [email or web address]  CC: Primary care physician; Patient, No Pcp Per

## 2023-03-21 NOTE — Assessment & Plan Note (Signed)
-   I suspect prerenal etiology due to volume depletion and dehydration. - We will continue hydration with IV normal saline and follow BMP. - We will avoid nephrotoxins.

## 2023-03-22 DIAGNOSIS — R55 Syncope and collapse: Secondary | ICD-10-CM | POA: Diagnosis not present

## 2023-03-22 DIAGNOSIS — T796XXA Traumatic ischemia of muscle, initial encounter: Secondary | ICD-10-CM | POA: Diagnosis not present

## 2023-03-22 LAB — BASIC METABOLIC PANEL
Anion gap: 8 (ref 5–15)
BUN: 17 mg/dL (ref 6–20)
CO2: 23 mmol/L (ref 22–32)
Calcium: 8.6 mg/dL — ABNORMAL LOW (ref 8.9–10.3)
Chloride: 106 mmol/L (ref 98–111)
Creatinine, Ser: 0.93 mg/dL (ref 0.61–1.24)
GFR, Estimated: >60 mL/min (ref >60)
Glucose, Bld: 104 mg/dL — ABNORMAL HIGH (ref 70–99)
Potassium: 3.9 mmol/L (ref 3.5–5.1)
Sodium: 137 mmol/L (ref 135–145)

## 2023-03-22 LAB — CK: Total CK: 4994 U/L — ABNORMAL HIGH (ref 49–397)

## 2023-03-22 LAB — GLUCOSE, CAPILLARY
Glucose-Capillary: 136 mg/dL — ABNORMAL HIGH (ref 70–99)
Glucose-Capillary: 115 mg/dL — ABNORMAL HIGH (ref 70–99)
Glucose-Capillary: 112 mg/dL — ABNORMAL HIGH (ref 70–99)
Glucose-Capillary: 127 mg/dL — ABNORMAL HIGH (ref 70–99)

## 2023-03-22 MED ORDER — UMECLIDINIUM BROMIDE 62.5 MCG/ACT IN AEPB
1.0000 | INHALATION_SPRAY | Freq: Every day | RESPIRATORY_TRACT | Status: DC
  Administered 2023-03-22 – 2023-03-23 (×2): 1 via RESPIRATORY_TRACT
  Filled 2023-03-22: qty 7

## 2023-03-22 MED ORDER — AMLODIPINE BESYLATE 10 MG PO TABS
10.0000 mg | ORAL_TABLET | Freq: Every day | ORAL | Status: DC
  Administered 2023-03-22 – 2023-03-23 (×2): 10 mg via ORAL
  Filled 2023-03-22 (×2): qty 1

## 2023-03-22 MED ORDER — FLUTICASONE FUROATE-VILANTEROL 100-25 MCG/ACT IN AEPB
1.0000 | INHALATION_SPRAY | Freq: Every day | RESPIRATORY_TRACT | Status: DC
  Administered 2023-03-22 – 2023-03-23 (×2): 1 via RESPIRATORY_TRACT
  Filled 2023-03-22: qty 28

## 2023-03-22 NOTE — Discharge Instructions (Signed)
Some PCP options in [place] area- not a comprehensive list  [facility]- [phone] [facility]- [phone] [facility]- [phone] [facility]- [phone] [facility]- [phone] [place] [facility]- [phone]  or [place] [facility]erral [facility] [phone]  

## 2023-03-22 NOTE — Progress Notes (Signed)
Mobility Specialist - Progress Note   Pre-mobility: HR, BP, SpO2 During mobility: HR, BP, SpO2 Post-mobility: HR, BP, SPO2     03/22/23 1707  Therapy Vitals  Temp 98.2 F (36.8 C)  Pulse Rate 73  Resp 18  BP (!) 139/94  Patient Position (if appropriate) Sitting  Oxygen Therapy  SpO2 98 %  O2 Device Room Air  Mobility  Activity Ambulated independently to bathroom;Stood at bedside;Repositioned in chair  Level of Assistance Independent  Assistive Device None  Distance Ambulated (ft) 10 ft  Range of Motion/Exercises Active  Activity Response Tolerated well  Mobility Referral Yes  $Mobility charge 1 Mobility  Mobility Specialist Stop Time (ACUTE ONLY) 1702   Pt standing in bathroom upon entry on RA. Pt ambulates around bed to recliner and left with needs in reach and chair alarm activated.   Johnathan Hausen Mobility Specialist 03/22/23, 5:46 PM

## 2023-03-22 NOTE — TOC CM/SW Note (Signed)
Transition of Care [facility]) - Inpatient Brief Assessment   Patient Details  Name: Cameron Prince MRN: [id number] Date of Birth: March 29, 1971  Transition of Care [facility]) CM/SW Contact:    Chapman Fitch, RN Phone Number: 03/22/2023, 3:13 PM   Clinical Narrative:  Patient states that he was released from [facility] on Friday and has been staying with his significant other, and will return home with her at discharge.  States she will provide discharge transportation  Patient denies issues obtaining medications Patient states that he would like a new PCP. Added list of local PCP to AVS and patient to call his insurance to confirm what provider is in network   Transition of Care Asessment: Insurance and Status: Insurance coverage has been reviewed Patient has primary care physician: No (List of local PCP added to AVS) Home environment has been reviewed: Home with girlfriend   Prior/Current Home Services: No current home services Social Determinants of Health Reivew: SDOH reviewed no interventions necessary Readmission risk has been reviewed: Yes Transition of care needs: no transition of care needs at this time

## 2023-03-22 NOTE — Progress Notes (Signed)
PROGRESS NOTE    Cameron Prince  ZOX:[id number] DOB: 1970-10-25 DOA: 03/21/2023 PCP: Patient, No Pcp Per   Assessment & Plan:   Principal Problem:   Syncope Active Problems:   AKI (acute kidney injury) (HCC)   Type 2 diabetes mellitus without complications (HCC)   Essential hypertension   History of DVT (deep vein thrombosis)  Assessment and Plan: Syncope: etiology unclear, ddx cocaine use vs arrhythmias vs vasovagal vs dehydration. Orthostatic vitals are neg. Continue on IVFs. Echo shows EF 60-65%, grade I diastolic function & no regional wall motion abnormalities.   Cocaine abuse: likely secondary to above. Received illicit drug use cessation counseling already   AKI: Cr is trending down today. Avoid nephrotoxic   Traumatic rhabdomyolysis: likely secondary to syncope & fall. CK is significantly elevated but trending down   Metabolic acidosis: resolved    DM2: HbA1c 5.2, well controlled. Continue on SSI w/ accuchecks     HTN: pt denies taking enalapril. Pharmacy consult to find out what meds the pt takes for HTN    Hx of DVT: continue on eliquis    DVT prophylaxis: eliquis Code Status: full  Family Communication:  Disposition Plan: likely d/c back home   Level of care: Telemetry Medical  Status is: Observation The patient remains OBS appropriate and will d/c before 2 midnights.    Consultants:    Procedures:   Antimicrobials:    Subjective: Pt c/o fatigue   Objective: Vitals:   03/21/23 1519 03/21/23 1606 03/21/23 2054 03/22/23 0335  BP: 112/82 (!) 138/114 (!) 148/82 130/84  Pulse: 66 73 65 70  Resp: 20 18 18 18   Temp: 98.4 F (36.9 C) 98.4 F (36.9 C) 98.6 F (37 C) 98.4 F (36.9 C)  TempSrc: Oral  Oral Oral  SpO2: 97% 97% 98% 97%  Weight:      Height:        Intake/Output Summary (Last 24 hours) at 03/22/2023 0717 Last data filed at 03/22/2023 0147 Gross per 24 hour  Intake 2063.27 ml  Output 500 ml  Net 1563.27 ml   Filed Weights    03/20/23 2320  Weight: 129.3 kg    Examination:  General exam: Appears comfortable  Respiratory system: clear breath sounds b/l  Cardiovascular system: S1/S2+. No rubs or clicks   Gastrointestinal system: Abd is soft, NT, obese & normal bowel sounds  Central nervous system: alert & oriented. Chronic stutter Psychiatry: Judgement and insight appears at baseline. Appropriate mood and affect    Data Reviewed: I have personally reviewed following labs and imaging studies  CBC: Recent Labs  Lab 03/20/23 2321 03/21/23 0500  WBC 7.9 7.5  HGB 16.4 14.7  HCT 51.5 46.4  MCV 86.8 87.4  PLT 285 261   Basic Metabolic Panel: Recent Labs  Lab 03/20/23 2321 03/21/23 0500 03/22/23 0430  NA 132* 134* 137  K 4.2 3.8 3.9  CL 99 102 106  CO2 19* 21* 23  GLUCOSE 118* 155* 104*  BUN 37* 33* 17  CREATININE 2.37* 1.76* 0.93  CALCIUM 10.1 9.2 8.6*   GFR: Estimated Creatinine Clearance: 138 mL/min (by C-G formula based on SCr of 0.93 mg/dL). Liver Function Tests: No results for input(s): "AST", "ALT", "ALKPHOS", "BILITOT", "PROT", "ALBUMIN" in the last 168 hours. No results for input(s): "LIPASE", "AMYLASE" in the last 168 hours. No results for input(s): "AMMONIA" in the last 168 hours. Coagulation Profile: No results for input(s): "INR", "PROTIME" in the last 168 hours. Cardiac Enzymes: Recent Labs  Lab 03/21/23  0500  CKTOTAL 9,704*   BNP (last 3 results) No results for input(s): "PROBNP" in the last 8760 hours. HbA1C: Recent Labs    03/20/23 2321  HGBA1C 5.2   CBG: Recent Labs  Lab 03/21/23 0502 03/21/23 0715 03/21/23 1212 03/21/23 1602 03/21/23 2055  GLUCAP 145* 121* 131* 89 88   Lipid Profile: No results for input(s): "CHOL", "HDL", "LDLCALC", "TRIG", "CHOLHDL", "LDLDIRECT" in the last 72 hours. Thyroid Function Tests: No results for input(s): "TSH", "T4TOTAL", "FREET4", "T3FREE", "THYROIDAB" in the last 72 hours. Anemia Panel: No results for input(s):  "VITAMINB12", "FOLATE", "FERRITIN", "TIBC", "IRON", "RETICCTPCT" in the last 72 hours. Sepsis Labs: No results for input(s): "PROCALCITON", "LATICACIDVEN" in the last 168 hours.  No results found for this or any previous visit (from the past 240 hour(s)).       Radiology Studies: ECHOCARDIOGRAM COMPLETE  Result Date: 03/21/2023    ECHOCARDIOGRAM REPORT   Patient Name:   Cameron Prince Date of Exam: 03/21/2023 Medical Rec #:  [id number]     Height:       76.0 in Accession #:    [id number]    Weight:       285.0 lb Date of Birth:  October 26, 1970     BSA:          2.575 m Patient Age:    52 years      BP:           133/63 mmHg Patient Gender: M             HR:           66 bpm. Exam Location:  [facility] Procedure: 2D Echo Indications:     Syncope R55  History:         Patient has no prior history of Echocardiogram examinations.  Sonographer:     Overton Mam RDCS Referring Phys:  [id number] Vanessa Kick A MANSY Diagnosing Phys: Julien Nordmann MD IMPRESSIONS  1. Left ventricular ejection fraction, by estimation, is 60 to 65%. The left ventricle has normal function. The left ventricle has no regional wall motion abnormalities. There is mild left ventricular hypertrophy. Left ventricular diastolic parameters are consistent with Grade I diastolic dysfunction (impaired relaxation).  2. Right ventricular systolic function is normal. The right ventricular size is normal.  3. The mitral valve is normal in structure. No evidence of mitral valve regurgitation. No evidence of mitral stenosis.  4. The aortic valve is tricuspid. Aortic valve regurgitation is not visualized. No aortic stenosis is present.  5. The inferior vena cava is normal in size with greater than 50% respiratory variability, suggesting right atrial pressure of 3 mmHg. FINDINGS  Left Ventricle: Left ventricular ejection fraction, by estimation, is 60 to 65%. The left ventricle has normal function. The left ventricle has no regional wall motion abnormalities. The left  ventricular internal cavity size was normal in size. There is  mild left ventricular hypertrophy. Left ventricular diastolic parameters are consistent with Grade I diastolic dysfunction (impaired relaxation). Right Ventricle: The right ventricular size is normal. No increase in right ventricular wall thickness. Right ventricular systolic function is normal. Left Atrium: Left atrial size was normal in size. Right Atrium: Right atrial size was normal in size. Pericardium: There is no evidence of pericardial effusion. Mitral Valve: The mitral valve is normal in structure. No evidence of mitral valve regurgitation. No evidence of mitral valve stenosis. Tricuspid Valve: The tricuspid valve is normal in structure. Tricuspid valve regurgitation is mild . No evidence of tricuspid  stenosis. Aortic Valve: The aortic valve is tricuspid. Aortic valve regurgitation is not visualized. No aortic stenosis is present. Aortic valve peak gradient measures 7.4 mmHg. Pulmonic Valve: The pulmonic valve was normal in structure. Pulmonic valve regurgitation is not visualized. No evidence of pulmonic stenosis. Aorta: The aortic root is normal in size and structure. Venous: The inferior vena cava is normal in size with greater than 50% respiratory variability, suggesting right atrial pressure of 3 mmHg. IAS/Shunts: No atrial level shunt detected by color flow Doppler.  LEFT VENTRICLE PLAX 2D LVIDd:         4.90 cm   Diastology LVIDs:         3.50 cm   LV e' medial:    8.38 cm/s LV PW:         1.20 cm   LV E/e' medial:  7.6 LV IVS:        1.40 cm   LV e' lateral:   13.40 cm/s LVOT diam:     2.30 cm   LV E/e' lateral: 4.7 LV SV:         86 LV SV Index:   33 LVOT Area:     4.15 cm  RIGHT VENTRICLE RV Basal diam:  2.10 cm RV S prime:     13.80 cm/s TAPSE (M-mode): 2.0 cm LEFT ATRIUM             Index        RIGHT ATRIUM          Index LA diam:        3.80 cm 1.48 cm/m   RA Area:     9.23 cm LA Vol (A2C):   22.8 ml 8.85 ml/m   RA Volume:    15.00 ml 5.83 ml/m LA Vol (A4C):   27.1 ml 10.52 ml/m LA Biplane Vol: 26.3 ml 10.21 ml/m  AORTIC VALVE                 PULMONIC VALVE AV Area (Vmax): 3.54 cm     RVOT Peak grad: 4 mmHg AV Vmax:        136.00 cm/s AV Peak Grad:   7.4 mmHg LVOT Vmax:      116.00 cm/s LVOT Vmean:     71.000 cm/s LVOT VTI:       0.207 m  AORTA Ao Root diam: 3.10 cm Ao Asc diam:  2.80 cm MITRAL VALVE MV Area (PHT): 2.00 cm    SHUNTS MV Decel Time: 380 msec    Systemic VTI:  0.21 m MV E velocity: 63.40 cm/s  Systemic Diam: 2.30 cm MV A velocity: 72.30 cm/s MV E/A ratio:  0.88 Julien Nordmann MD Electronically signed by Julien Nordmann MD Signature Date/Time: 03/21/2023/3:53:52 PM    Final         Scheduled Meds:  apixaban  5 mg Oral BID   insulin aspart  0-5 Units Subcutaneous QHS   insulin aspart  0-9 Units Subcutaneous TID WC   sodium chloride flush  3 mL Intravenous Q12H   Continuous Infusions:  sodium chloride 75 mL/hr at 03/22/23 0147     LOS: 0 days    Time spent: 35 mins     Charise Killian, MD [facility] Pager [phone]  If 7PM-7AM, please contact night-coverage [email or web address] 03/22/2023, 7:17 AM

## 2023-03-23 ENCOUNTER — Other Ambulatory Visit: Payer: Self-pay

## 2023-03-23 DIAGNOSIS — F1410 Cocaine abuse, uncomplicated: Secondary | ICD-10-CM | POA: Diagnosis not present

## 2023-03-23 DIAGNOSIS — R55 Syncope and collapse: Secondary | ICD-10-CM | POA: Diagnosis not present

## 2023-03-23 LAB — BASIC METABOLIC PANEL
Anion gap: 7 (ref 5–15)
BUN: 14 mg/dL (ref 6–20)
CO2: 23 mmol/L (ref 22–32)
Calcium: 8.5 mg/dL — ABNORMAL LOW (ref 8.9–10.3)
Chloride: 106 mmol/L (ref 98–111)
Creatinine, Ser: 0.88 mg/dL (ref 0.61–1.24)
GFR, Estimated: >60 mL/min (ref >60)
Glucose, Bld: 100 mg/dL — ABNORMAL HIGH (ref 70–99)
Potassium: 3.8 mmol/L (ref 3.5–5.1)
Sodium: 136 mmol/L (ref 135–145)

## 2023-03-23 LAB — GLUCOSE, CAPILLARY
Glucose-Capillary: 111 mg/dL — ABNORMAL HIGH (ref 70–99)
Glucose-Capillary: 118 mg/dL — ABNORMAL HIGH (ref 70–99)

## 2023-03-23 LAB — CK: Total CK: 2427 U/L — ABNORMAL HIGH (ref 49–397)

## 2023-03-23 MED ORDER — FLUTICASONE-SALMETEROL 100-50 MCG/ACT IN AEPB
1.0000 | INHALATION_SPRAY | Freq: Every day | RESPIRATORY_TRACT | 0 refills | Status: AC
  Filled 2023-03-23 (×2): qty 60, 30d supply, fill #0

## 2023-03-23 MED ORDER — METFORMIN HCL 500 MG PO TABS
500.0000 mg | ORAL_TABLET | Freq: Two times a day (BID) | ORAL | 0 refills | Status: AC
  Filled 2023-03-23: qty 60, 30d supply, fill #0

## 2023-03-23 MED ORDER — HYDRALAZINE HCL 50 MG PO TABS: 50.0000 mg | ORAL_TABLET | Freq: Four times a day (QID) | ORAL | Status: DC | PRN

## 2023-03-23 MED ORDER — HYDROCHLOROTHIAZIDE 25 MG PO TABS
25.0000 mg | ORAL_TABLET | Freq: Every day | ORAL | Status: DC
  Administered 2023-03-23: 25 mg via ORAL
  Filled 2023-03-23: qty 1

## 2023-03-23 MED ORDER — APIXABAN 5 MG PO TABS
5.0000 mg | ORAL_TABLET | Freq: Two times a day (BID) | ORAL | 0 refills | Status: AC
  Filled 2023-03-23: qty 60, 30d supply, fill #0

## 2023-03-23 MED ORDER — HYDROCHLOROTHIAZIDE 25 MG PO TABS
25.0000 mg | ORAL_TABLET | Freq: Every day | ORAL | 0 refills | Status: AC
  Filled 2023-03-23: qty 30, 30d supply, fill #0

## 2023-03-23 NOTE — Progress Notes (Signed)
I have reviewed and concur with this student's documentation. CI was with student during medication pass.   Leodis Sias, RN 03/23/2023 3:10 PM

## 2023-03-23 NOTE — Discharge Summary (Signed)
Physician Discharge Summary  Cameron Prince:[id number] DOB: Sep 20, 1971 DOA: 03/21/2023  PCP: Patient, No Pcp Per  Admit date: 03/21/2023 Discharge date: 03/23/2023  Admitted From: home  Disposition:  home  Recommendations for Outpatient Follow-up:  Follow up with PCP in 1-2 weeks  Home Health: no  Equipment/Devices:  Discharge Condition: stable CODE STATUS:full Diet recommendation: Heart Healthy / Carb Modified / Regular / Dysphagia   Brief/Interim Summary: HPI was taken from Dr. Arville Care: [place]ham Meneely is a 52 y.o. male with medical history significant for hypertension, type diabetes mellitus, history of DVT, PTSD and depression, presented to the emergency room with acute onset of syncope that lasted about 1 to 2 minutes.  He has been feet paresthesias but denies any new paresthesias or focal muscle weakness.  No chest pain or palpitations.  No nausea or vomiting or diarrhea.  He apparently had abdominal pain earlier in the evening but currently denied it.  No fever or chills.  No dysuria, oliguria or hematuria or flank pain.  He denies any cough or wheezing or hemoptysis.   ED Course: When he came to the ER, his BP was 140/109 with otherwise normal vital signs.  Labs revealed mild hyponatremia 132 and CO2 was 19 with blood glucose of 118, BUN of 37 creatinine 2.37 compared to 14 and 0.95 on 08/26/2022.  CBC was unremarkable. EKG as reviewed by me : EKG showed normal sinus rhythm with a rate of 94 with incomplete right bundle branch block, left intrafascicular block and LVH with T wave inversion laterally. Imaging: None.   The patient was given 1 L bolus of IV normal saline.  He will be admitted to an observation medical telemetry bed for further evaluation and management.    Discharge Diagnoses:  Principal Problem:   Syncope Active Problems:   AKI (acute kidney injury) (HCC)   Type 2 diabetes mellitus without complications (HCC)   Essential hypertension   History of DVT (deep  vein thrombosis)  Syncope: etiology unclear, ddx cocaine use vs arrhythmias vs vasovagal vs dehydration. Orthostatic vitals are neg. Continue on IVFs. Echo shows EF 60-65%, grade I diastolic function & no regional wall motion abnormalities. No arrhythmias found on tele    Cocaine abuse: likely secondary to above. Received illicit drug use cessation counseling already    AKI: resolved    Traumatic rhabdomyolysis: likely secondary to syncope & fall. CK is still elevated but trending down. AKI has resolved    Metabolic acidosis: resolved    DM2: HbA1c 5.2, well controlled. Continue on SSI w/ accuchecks while inpatient   HTN: restarted home dose of HCTZ   Hx of DVT: continue on eliquis   Obesity: BMI 33.6. Would benefit from weight loss  Discharge Instructions  Discharge Instructions     Diet - low sodium heart healthy   Complete by: As directed    Diet Carb Modified   Complete by: As directed    Discharge instructions   Complete by: As directed    F/u w/ PCP in 1-2 weeks   Increase activity slowly   Complete by: As directed       Allergies as of 03/23/2023       Reactions   Lisinopril Cough        Medication List     TAKE these medications    apixaban 5 MG Tabs tablet Commonly known as: ELIQUIS Take 1 tablet (5 mg total) by mouth 2 (two) times daily.   hydrochlorothiazide 25 MG tablet Commonly known as:  HYDRODIURIL Take 1 tablet (25 mg total) by mouth daily.   metFORMIN 500 MG tablet Commonly known as: Glucophage Take 1 tablet (500 mg total) by mouth 2 (two) times daily with a meal.   Trelegy Ellipta 100-62.5-25 MCG/ACT Aepb Generic drug: Fluticasone-Umeclidin-Vilant Inhale 1 puff into the lungs daily.        Allergies  Allergen Reactions   Lisinopril Cough    Consultations:    Procedures/Studies: ECHOCARDIOGRAM COMPLETE  Result Date: 03/21/2023    ECHOCARDIOGRAM REPORT   Patient Name:   Cameron Prince Date of Exam: 03/21/2023 Medical Rec #:   [id number]     Height:       76.0 in Accession #:    [id number]    Weight:       285.0 lb Date of Birth:  [birth year]     BSA:          2.575 m Patient Age:    51 years      BP:           133/63 mmHg Patient Gender: M             HR:           66 bpm. Exam Location:  [facility] Procedure: 2D Echo Indications:     Syncope R55  History:         Patient has no prior history of Echocardiogram examinations.  Sonographer:     Overton Mam RDCS Referring Phys:  [id number] Vanessa Kick A MANSY Diagnosing Phys: Julien Nordmann MD IMPRESSIONS  1. Left ventricular ejection fraction, by estimation, is 60 to 65%. The left ventricle has normal function. The left ventricle has no regional wall motion abnormalities. There is mild left ventricular hypertrophy. Left ventricular diastolic parameters are consistent with Grade I diastolic dysfunction (impaired relaxation).  2. Right ventricular systolic function is normal. The right ventricular size is normal.  3. The mitral valve is normal in structure. No evidence of mitral valve regurgitation. No evidence of mitral stenosis.  4. The aortic valve is tricuspid. Aortic valve regurgitation is not visualized. No aortic stenosis is present.  5. The inferior vena cava is normal in size with greater than 50% respiratory variability, suggesting right atrial pressure of 3 mmHg. FINDINGS  Left Ventricle: Left ventricular ejection fraction, by estimation, is 60 to 65%. The left ventricle has normal function. The left ventricle has no regional wall motion abnormalities. The left ventricular internal cavity size was normal in size. There is  mild left ventricular hypertrophy. Left ventricular diastolic parameters are consistent with Grade I diastolic dysfunction (impaired relaxation). Right Ventricle: The right ventricular size is normal. No increase in right ventricular wall thickness. Right ventricular systolic function is normal. Left Atrium: Left atrial size was normal in size. Right Atrium: Right atrial size  was normal in size. Pericardium: There is no evidence of pericardial effusion. Mitral Valve: The mitral valve is normal in structure. No evidence of mitral valve regurgitation. No evidence of mitral valve stenosis. Tricuspid Valve: The tricuspid valve is normal in structure. Tricuspid valve regurgitation is mild . No evidence of tricuspid stenosis. Aortic Valve: The aortic valve is tricuspid. Aortic valve regurgitation is not visualized. No aortic stenosis is present. Aortic valve peak gradient measures 7.4 mmHg. Pulmonic Valve: The pulmonic valve was normal in structure. Pulmonic valve regurgitation is not visualized. No evidence of pulmonic stenosis. Aorta: The aortic root is normal in size and structure. Venous: The inferior vena cava is normal in size with greater than 50%  respiratory variability, suggesting right atrial pressure of 3 mmHg. IAS/Shunts: No atrial level shunt detected by color flow Doppler.  LEFT VENTRICLE PLAX 2D LVIDd:         4.90 cm   Diastology LVIDs:         3.50 cm   LV e' medial:    8.38 cm/s LV PW:         1.20 cm   LV E/e' medial:  7.6 LV IVS:        1.40 cm   LV e' lateral:   13.40 cm/s LVOT diam:     2.30 cm   LV E/e' lateral: 4.7 LV SV:         86 LV SV Index:   33 LVOT Area:     4.15 cm  RIGHT VENTRICLE RV Basal diam:  2.10 cm RV S prime:     13.80 cm/s TAPSE (M-mode): 2.0 cm LEFT ATRIUM             Index        RIGHT ATRIUM          Index LA diam:        3.80 cm 1.48 cm/m   RA Area:     9.23 cm LA Vol (A2C):   22.8 ml 8.85 ml/m   RA Volume:   15.00 ml 5.83 ml/m LA Vol (A4C):   27.1 ml 10.52 ml/m LA Biplane Vol: 26.3 ml 10.21 ml/m  AORTIC VALVE                 PULMONIC VALVE AV Area (Vmax): 3.54 cm     RVOT Peak grad: 4 mmHg AV Vmax:        136.00 cm/s AV Peak Grad:   7.4 mmHg LVOT Vmax:      116.00 cm/s LVOT Vmean:     71.000 cm/s LVOT VTI:       0.207 m  AORTA Ao Root diam: 3.10 cm Ao Asc diam:  2.80 cm MITRAL VALVE MV Area (PHT): 2.00 cm    SHUNTS MV Decel Time: 380 msec     Systemic VTI:  0.21 m MV E velocity: 63.40 cm/s  Systemic Diam: 2.30 cm MV A velocity: 72.30 cm/s MV E/A ratio:  0.88 Julien Nordmann MD Electronically signed by Julien Nordmann MD Signature Date/Time: 03/21/2023/3:53:52 PM    Final    (Echo, Carotid, EGD, Colonoscopy, ERCP)    Subjective: Pt c/o fatigue    Discharge Exam: Vitals:   03/23/23 0927 03/23/23 0931  BP: (!) 181/113 (!) 172/120  Pulse: 78 89  Resp:    Temp:    SpO2: 97% 97%   Vitals:   03/23/23 0922 03/23/23 0925 03/23/23 0927 03/23/23 0931  BP: (!) 140/83 (!) 173/104 (!) 181/113 (!) 172/120  Pulse: 66 97 78 89  Resp:      Temp:      TempSrc:      SpO2: 96% 96% 97% 97%  Weight:      Height:        General: Pt is alert, awake, not in acute distress Cardiovascular: S1/S2 +, no rubs, no gallops Respiratory: CTA bilaterally, no wheezing, no rhonchi Abdominal: Soft, NT, obese, bowel sounds + Extremities: no edema, no cyanosis    The results of significant diagnostics from this hospitalization (including imaging, microbiology, ancillary and laboratory) are listed below for reference.     Microbiology: No results found for this or any previous visit (from the past 240 hour(s)).  Labs: BNP (last 3 results) No results for input(s): "BNP" in the last 8760 hours. Basic Metabolic Panel: Recent Labs  Lab 03/20/23 2321 03/21/23 0500 03/22/23 0430 03/23/23 0322  NA 132* 134* 137 136  K 4.2 3.8 3.9 3.8  CL 99 102 106 106  CO2 19* 21* 23 23  GLUCOSE 118* 155* 104* 100*  BUN 37* 33* 17 14  CREATININE 2.37* 1.76* 0.93 0.88  CALCIUM 10.1 9.2 8.6* 8.5*   Liver Function Tests: No results for input(s): "AST", "ALT", "ALKPHOS", "BILITOT", "PROT", "ALBUMIN" in the last 168 hours. No results for input(s): "LIPASE", "AMYLASE" in the last 168 hours. No results for input(s): "AMMONIA" in the last 168 hours. CBC: Recent Labs  Lab 03/20/23 2321 03/21/23 0500  WBC 7.9 7.5  HGB 16.4 14.7  HCT 51.5 46.4  MCV 86.8  87.4  PLT 285 261   Cardiac Enzymes: Recent Labs  Lab 03/21/23 0500 03/22/23 0430 03/23/23 0322  CKTOTAL 9,704* 4,994* 2,427*   BNP: Invalid input(s): "POCBNP" CBG: Recent Labs  Lab 03/22/23 0909 03/22/23 1225 03/22/23 1702 03/22/23 2117 03/23/23 0822  GLUCAP 136* 115* 112* 127* 111*   D-Dimer No results for input(s): "DDIMER" in the last 72 hours. Hgb A1c Recent Labs    03/20/23 2321  HGBA1C 5.2   Lipid Profile No results for input(s): "CHOL", "HDL", "LDLCALC", "TRIG", "CHOLHDL", "LDLDIRECT" in the last 72 hours. Thyroid function studies No results for input(s): "TSH", "T4TOTAL", "T3FREE", "THYROIDAB" in the last 72 hours.  Invalid input(s): "FREET3" Anemia work up No results for input(s): "VITAMINB12", "FOLATE", "FERRITIN", "TIBC", "IRON", "RETICCTPCT" in the last 72 hours. Urinalysis    Component Value Date/Time   COLORURINE YELLOW (A) 03/21/2023 0744   APPEARANCEUR HAZY (A) 03/21/2023 0744   LABSPEC 1.026 03/21/2023 0744   PHURINE 5.0 03/21/2023 0744   GLUCOSEU NEGATIVE 03/21/2023 0744   HGBUR NEGATIVE 03/21/2023 0744   BILIRUBINUR NEGATIVE 03/21/2023 0744   KETONESUR 5 (A) 03/21/2023 0744   PROTEINUR 30 (A) 03/21/2023 0744   NITRITE NEGATIVE 03/21/2023 0744   LEUKOCYTESUR NEGATIVE 03/21/2023 0744   Sepsis Labs Recent Labs  Lab 03/20/23 2321 03/21/23 0500  WBC 7.9 7.5   Microbiology No results found for this or any previous visit (from the past 240 hour(s)).   Time coordinating discharge: Over 30 minutes  SIGNED:   Charise Killian, MD  [facility] 03/23/2023, 11:41 AM Pager   If 7PM-7AM, please contact night-coverage [email or web address]

## 2023-03-23 NOTE — Progress Notes (Signed)
Patient provided with medications by pharmacist, all questions and concerns addressed at this time. Discharge paperwork and education provided to patient. Patient gathered belongings and escorted off unit by member of unit staff to friend's vehicle.

## 2023-06-19 ENCOUNTER — Other Ambulatory Visit: Payer: Self-pay

## 2023-06-19 ENCOUNTER — Emergency Department (HOSPITAL_COMMUNITY)
Admission: EM | Admit: 2023-06-19 | Discharge: 2023-06-20 | Disposition: A | Discharge status: Home / Self Care | Payer: Commercial Managed Care - HMO | Attending: Emergency Medicine | Admitting: Emergency Medicine

## 2023-06-19 ENCOUNTER — Encounter (HOSPITAL_COMMUNITY): Payer: Self-pay

## 2023-06-19 DIAGNOSIS — Z5321 Procedure and treatment not carried out due to patient leaving prior to being seen by health care provider: Secondary | ICD-10-CM | POA: Diagnosis not present

## 2023-06-19 DIAGNOSIS — R361 Hematospermia: Secondary | ICD-10-CM | POA: Diagnosis not present

## 2023-06-19 DIAGNOSIS — R319 Hematuria, unspecified: Secondary | ICD-10-CM | POA: Insufficient documentation

## 2023-06-19 NOTE — ED Triage Notes (Addendum)
Bleeding on urination x2 days Blood in semen today.  Stated he passed blood clot from penis 30-40 mins ago.  Denies issues with BM Denies STD's Pt is on blood thinners.

## 2023-07-08 ENCOUNTER — Other Ambulatory Visit: Payer: Self-pay
